# Patient Record
Sex: Male | Born: 1990 | Race: White | Hispanic: No | Marital: Single | Smoking: Current every day smoker
Health system: Southern US, Community
[De-identification: ages and names within clinical notes are randomized; demographics above are authoritative.]

---
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
## 2012-01-18 ENCOUNTER — Encounter (HOSPITAL_COMMUNITY): Payer: Self-pay

## 2012-01-18 ENCOUNTER — Emergency Department (HOSPITAL_COMMUNITY)
Admission: EM | Admit: 2012-01-18 | Discharge: 2012-01-19 | Disposition: A | Discharge status: Home / Self Care | Payer: Self-pay | Attending: Emergency Medicine | Admitting: Emergency Medicine

## 2012-01-18 ENCOUNTER — Emergency Department (HOSPITAL_COMMUNITY): Payer: Self-pay

## 2012-01-18 DIAGNOSIS — S63602A Unspecified sprain of left thumb, initial encounter: Secondary | ICD-10-CM

## 2012-01-18 DIAGNOSIS — Y929 Unspecified place or not applicable: Secondary | ICD-10-CM | POA: Insufficient documentation

## 2012-01-18 DIAGNOSIS — IMO0002 Reserved for concepts with insufficient information to code with codable children: Secondary | ICD-10-CM | POA: Insufficient documentation

## 2012-01-18 DIAGNOSIS — Y939 Activity, unspecified: Secondary | ICD-10-CM | POA: Insufficient documentation

## 2012-01-18 DIAGNOSIS — S6390XA Sprain of unspecified part of unspecified wrist and hand, initial encounter: Secondary | ICD-10-CM | POA: Insufficient documentation

## 2012-01-18 DIAGNOSIS — F17200 Nicotine dependence, unspecified, uncomplicated: Secondary | ICD-10-CM | POA: Insufficient documentation

## 2012-01-18 NOTE — ED Provider Notes (Signed)
History     CSN: [id number]  Arrival date & time 01/18/12  2323   First MD Initiated Contact with Patient 01/18/12 2334      Chief Complaint  Patient presents with  . Finger Injury    (Consider location/radiation/quality/duration/timing/severity/associated sxs/prior treatment) HPI Comments: Patient with hx of chronic pain to the left thumb c/o increased pain today after he struck his thumb on a door earlier today.  Describes a throbbing sensation to the proximal thumb and into the wrist.  He reports some swelling earlier.  He has been taking ibuprofen and Aleve for the pain.  Pain to the thumb is worse with movement.  He denies numbness, weakness to the hand or elbow pain.  Patient is left hand dominant  The history is provided by the patient.    History reviewed. No pertinent past medical history.  History reviewed. No pertinent past surgical history.  No family history on file.  History  Substance Use Topics  . Smoking status: Current Every Day Smoker  . Smokeless tobacco: Not on file  . Alcohol Use: No      Review of Systems  Constitutional: Negative for fever and chills.  Genitourinary: Negative for dysuria and difficulty urinating.  Musculoskeletal: Positive for joint swelling and arthralgias.  Skin: Negative for color change and wound.  All other systems reviewed and are negative.    Allergies  Review of patient's allergies indicates no known allergies.  Home Medications  No current outpatient prescriptions on file.  BP 103/82  Pulse 98  Temp 98.6 F (37 C) (Oral)  Resp 20  SpO2 98%  Physical Exam  Nursing note and vitals reviewed. Constitutional: He is oriented to person, place, and time. He appears well-developed and well-nourished. No distress.  HENT:  Head: Normocephalic and atraumatic.  Cardiovascular: Normal rate, regular rhythm, normal heart sounds and intact distal pulses.   No murmur heard. Pulmonary/Chest: Effort normal and breath  sounds normal.  Musculoskeletal: He exhibits tenderness. He exhibits no edema.       Left hand: He exhibits decreased range of motion, tenderness and swelling. He exhibits normal two-point discrimination, normal capillary refill, no deformity and no laceration. Normal strength noted.       Hands:      ttp of the proximal left thumb extending to the thenar eminence.  Radial pulse is brisk, distal sensation is slightly diminished.  CR< 2 sec.  No bruising or deformity.    Neurological: He is alert and oriented to person, place, and time. He exhibits normal muscle tone. Coordination normal.  Skin: Skin is warm and dry.    ED Course  Procedures (including critical care time)  Labs Reviewed - No data to display No results found.   Dg Finger Thumb Left  01/19/2012  *RADIOLOGY REPORT*  Clinical Data: Hyperextension injury, left thumb pain.  LEFT THUMB 2+V  Comparison: None.  Findings: No displaced fracture.  No dislocation.  No aggressive osseous lesion.  IMPRESSION: No acute osseous abnormality of the left thumb.   Original Report Authenticated By: Waneta Martins, M.D.      MDM   velcro thumb spica applied, pain improved, remains NV intact  Likely sprain to the thumb.  Pt agrees to f/u with orthopedics.  Prescribed: Naprosyn Ultram #20       Akeel Reffner L. [place], [place] 01/19/12 [phone]

## 2012-01-18 NOTE — ED Notes (Signed)
Pt states he hyperextended his left thumb a month ago, has been having pain to same since then, states he hit it again tonight and wants it checked, mild swelling noted.

## 2012-01-19 MED ORDER — TRAMADOL HCL 50 MG PO TABS: 50.0000 mg | ORAL_TABLET | Freq: Four times a day (QID) | ORAL | Status: DC | PRN

## 2012-01-19 MED ORDER — HYDROCODONE-ACETAMINOPHEN 5-325 MG PO TABS: ORAL_TABLET | ORAL | Status: DC

## 2012-01-19 MED ORDER — NAPROXEN 500 MG PO TABS: 500.0000 mg | ORAL_TABLET | Freq: Two times a day (BID) | ORAL | Status: DC

## 2012-01-19 NOTE — ED Provider Notes (Signed)
Medical screening examination/treatment/procedure(s) were performed by non-physician practitioner and as supervising physician I was immediately available for consultation/collaboration.   Traniece Boffa, MD 01/19/12 0634 

## 2012-01-22 MED FILL — Hydrocodone-Acetaminophen Tab 5-325 MG: ORAL | Qty: 6 | Status: AC

## 2014-03-22 ENCOUNTER — Emergency Department: Payer: Self-pay | Admitting: Internal Medicine

## 2014-03-22 LAB — COMPREHENSIVE METABOLIC PANEL
ALBUMIN: 4.1 g/dL (ref 3.4–5.0)
ALK PHOS: 89 U/L
Anion Gap: 7 (ref 7–16)
BUN: 8 mg/dL (ref 7–18)
Bilirubin,Total: 0.4 mg/dL (ref 0.2–1.0)
CHLORIDE: 110 mmol/L — AB (ref 98–107)
CO2: 27 mmol/L (ref 21–32)
CREATININE: 0.97 mg/dL (ref 0.60–1.30)
Calcium, Total: 8.9 mg/dL (ref 8.5–10.1)
Glucose: 98 mg/dL (ref 65–99)
OSMOLALITY: 285 (ref 275–301)
POTASSIUM: 3.7 mmol/L (ref 3.5–5.1)
SGOT(AST): 23 U/L (ref 15–37)
SGPT (ALT): 24 U/L
SODIUM: 144 mmol/L (ref 136–145)
Total Protein: 7.8 g/dL (ref 6.4–8.2)

## 2014-03-22 LAB — CBC
HCT: 47.9 % (ref 40.0–52.0)
HGB: 15.6 g/dL (ref 13.0–18.0)
MCH: 29.5 pg (ref 26.0–34.0)
MCHC: 32.7 g/dL (ref 32.0–36.0)
MCV: 90 fL (ref 80–100)
Platelet: 220 10*3/uL (ref 150–440)
RBC: 5.3 10*6/uL (ref 4.40–5.90)
RDW: 13.5 % (ref 11.5–14.5)
WBC: 8.9 10*3/uL (ref 3.8–10.6)

## 2014-03-22 LAB — ED INFLUENZA
H1N1FLUPCR: NOT DETECTED
INFLBPCR: NEGATIVE
Influenza A By PCR: NEGATIVE

## 2014-03-22 LAB — CK TOTAL AND CKMB (NOT AT ARMC)
CK, TOTAL: 67 U/L (ref 39–308)
CK-MB: <0.5 ng/mL — ABNORMAL LOW (ref 0.5–3.6)

## 2014-08-16 ENCOUNTER — Encounter (HOSPITAL_COMMUNITY): Payer: Self-pay | Admitting: *Deleted

## 2014-08-16 ENCOUNTER — Emergency Department (HOSPITAL_COMMUNITY)
Admission: EM | Admit: 2014-08-16 | Discharge: 2014-08-16 | Disposition: A | Discharge status: Home / Self Care | Payer: Commercial Indemnity | Attending: Emergency Medicine | Admitting: Emergency Medicine

## 2014-08-16 ENCOUNTER — Emergency Department (HOSPITAL_COMMUNITY): Payer: Commercial Indemnity

## 2014-08-16 DIAGNOSIS — Y9389 Activity, other specified: Secondary | ICD-10-CM | POA: Insufficient documentation

## 2014-08-16 DIAGNOSIS — Y92009 Unspecified place in unspecified non-institutional (private) residence as the place of occurrence of the external cause: Secondary | ICD-10-CM | POA: Diagnosis not present

## 2014-08-16 DIAGNOSIS — Y999 Unspecified external cause status: Secondary | ICD-10-CM | POA: Diagnosis not present

## 2014-08-16 DIAGNOSIS — S8001XA Contusion of right knee, initial encounter: Secondary | ICD-10-CM

## 2014-08-16 DIAGNOSIS — S80211A Abrasion, right knee, initial encounter: Secondary | ICD-10-CM | POA: Insufficient documentation

## 2014-08-16 DIAGNOSIS — S8991XA Unspecified injury of right lower leg, initial encounter: Secondary | ICD-10-CM | POA: Diagnosis present

## 2014-08-16 DIAGNOSIS — W010XXA Fall on same level from slipping, tripping and stumbling without subsequent striking against object, initial encounter: Secondary | ICD-10-CM | POA: Diagnosis not present

## 2014-08-16 DIAGNOSIS — Z720 Tobacco use: Secondary | ICD-10-CM | POA: Diagnosis not present

## 2014-08-16 DIAGNOSIS — M25561 Pain in right knee: Secondary | ICD-10-CM

## 2014-08-16 MED ORDER — OXYCODONE-ACETAMINOPHEN 5-325 MG PO TABS
2.0000 | ORAL_TABLET | Freq: Once | ORAL | Status: AC
  Administered 2014-08-16: 2 via ORAL
  Filled 2014-08-16: qty 2

## 2014-08-16 MED ORDER — NAPROXEN 250 MG PO TABS
500.0000 mg | ORAL_TABLET | Freq: Once | ORAL | Status: AC
  Administered 2014-08-16: 500 mg via ORAL
  Filled 2014-08-16: qty 2

## 2014-08-16 MED ORDER — OXYCODONE-ACETAMINOPHEN 5-325 MG PO TABS
1.0000 | ORAL_TABLET | Freq: Four times a day (QID) | ORAL | Status: DC | PRN
Start: 2014-08-16 — End: 2016-10-08

## 2014-08-16 MED ORDER — NAPROXEN 500 MG PO TABS: 500.0000 mg | ORAL_TABLET | Freq: Two times a day (BID) | ORAL | Status: DC

## 2014-08-16 NOTE — Discharge Instructions (Signed)
Abrasion An abrasion is a cut or scrape of the skin. Abrasions do not extend through all layers of the skin and most heal within 10 days. It is important to care for your abrasion properly to prevent infection. CAUSES  Most abrasions are caused by falling on, or gliding across, the ground or other surface. When your skin rubs on something, the outer and inner layer of skin rubs off, causing an abrasion. DIAGNOSIS  Your caregiver will be able to diagnose an abrasion during a physical exam.  TREATMENT  Your treatment depends on how large and deep the abrasion is. Generally, your abrasion will be cleaned with water and a mild soap to remove any dirt or debris. An antibiotic ointment may be put over the abrasion to prevent an infection. A bandage (dressing) may be wrapped around the abrasion to keep it from getting dirty.  You may need a tetanus shot if:  You cannot remember when you had your last tetanus shot.  You have never had a tetanus shot.  The injury broke your skin. If you get a tetanus shot, your arm may swell, get red, and feel warm to the touch. This is common and not a problem. If you need a tetanus shot and you choose not to have one, there is a rare chance of getting tetanus. Sickness from tetanus can be serious.  HOME CARE INSTRUCTIONS   If a dressing was applied, change it at least once a day or as directed by your caregiver. If the bandage sticks, soak it off with warm water.   Wash the area with water and a mild soap to remove all the ointment 2 times a day. Rinse off the soap and pat the area dry with a clean towel.   Reapply any ointment as directed by your caregiver. This will help prevent infection and keep the bandage from sticking. Use gauze over the wound and under the dressing to help keep the bandage from sticking.   Change your dressing right away if it becomes wet or dirty.   Only take over-the-counter or prescription medicines for pain, discomfort, or fever as  directed by your caregiver.   Follow up with your caregiver within 24-48 hours for a wound check, or as directed. If you were not given a wound-check appointment, look closely at your abrasion for redness, swelling, or pus. These are signs of infection. SEEK IMMEDIATE MEDICAL CARE IF:   You have increasing pain in the wound.   You have redness, swelling, or tenderness around the wound.   You have pus coming from the wound.   You have a fever or persistent symptoms for more than 2-3 days.  You have a fever and your symptoms suddenly get worse.  You have a bad smell coming from the wound or dressing.  MAKE SURE YOU:   Understand these instructions.  Will watch your condition.  Will get help right away if you are not doing well or get worse. Document Released: 12/21/2004 Document Revised: 02/28/2012 Document Reviewed: 02/14/2011 [facility] Patient Information 2015 [place], [place]. This information is not intended to replace advice given to you by your health care provider. Make sure you discuss any questions you have with your health care provider.  Cryotherapy Cryotherapy means treatment with cold. Ice or gel packs can be used to reduce both pain and swelling. Ice is the most helpful within the first 24 to 48 hours after an injury or flare-up from overusing a muscle or joint. Sprains, strains, spasms,  burning pain, shooting pain, and aches can all be eased with ice. Ice can also be used when recovering from surgery. Ice is effective, has very few side effects, and is safe for most people to use. PRECAUTIONS  Ice is not a safe treatment option for people with:  Raynaud phenomenon. This is a condition affecting small blood vessels in the extremities. Exposure to cold may cause your problems to return.  Cold hypersensitivity. There are many forms of cold hypersensitivity, including:  Cold urticaria. Red, itchy hives appear on the skin when the tissues begin to warm after being  iced.  Cold erythema. This is a red, itchy rash caused by exposure to cold.  Cold hemoglobinuria. Red blood cells break down when the tissues begin to warm after being iced. The hemoglobin that carry oxygen are passed into the urine because they cannot combine with blood proteins fast enough.  Numbness or altered sensitivity in the area being iced. If you have any of the following conditions, do not use ice until you have discussed cryotherapy with your caregiver:  Heart conditions, such as arrhythmia, angina, or chronic heart disease.  High blood pressure.  Healing wounds or open skin in the area being iced.  Current infections.  Rheumatoid arthritis.  Poor circulation.  Diabetes. Ice slows the blood flow in the region it is applied. This is beneficial when trying to stop inflamed tissues from spreading irritating chemicals to surrounding tissues. However, if you expose your skin to cold temperatures for too long or without the proper protection, you can damage your skin or nerves. Watch for signs of skin damage due to cold. HOME CARE INSTRUCTIONS Follow these tips to use ice and cold packs safely.  Place a dry or damp towel between the ice and skin. A damp towel will cool the skin more quickly, so you may need to shorten the time that the ice is used.  For a more rapid response, add gentle compression to the ice.  Ice for no more than 10 to 20 minutes at a time. The bonier the area you are icing, the less time it will take to get the benefits of ice.  Check your skin after 5 minutes to make sure there are no signs of a poor response to cold or skin damage.  Rest 20 minutes or more between uses.  Once your skin is numb, you can end your treatment. You can test numbness by very lightly touching your skin. The touch should be so light that you do not see the skin dimple from the pressure of your fingertip. When using ice, most people will feel these normal sensations in this order:  cold, burning, aching, and numbness.  Do not use ice on someone who cannot communicate their responses to pain, such as small children or people with dementia. HOW TO MAKE AN ICE PACK Ice packs are the most common way to use ice therapy. Other methods include ice massage, ice baths, and cryosprays. Muscle creams that cause a cold, tingly feeling do not offer the same benefits that ice offers and should not be used as a substitute unless recommended by your caregiver. To make an ice pack, do one of the following:  Place crushed ice or a bag of frozen vegetables in a sealable plastic bag. Squeeze out the excess air. Place this bag inside another plastic bag. Slide the bag into a pillowcase or place a damp towel between your skin and the bag.  Mix 3 parts water with  1 part rubbing alcohol. Freeze the mixture in a sealable plastic bag. When you remove the mixture from the freezer, it will be slushy. Squeeze out the excess air. Place this bag inside another plastic bag. Slide the bag into a pillowcase or place a damp towel between your skin and the bag. SEEK MEDICAL CARE IF:  You develop white spots on your skin. This may give the skin a blotchy (mottled) appearance.  Your skin turns blue or pale.  Your skin becomes waxy or hard.  Your swelling gets worse. MAKE SURE YOU:   Understand these instructions.  Will watch your condition.  Will get help right away if you are not doing well or get worse. Document Released: 11/07/2010 Document Revised: 07/28/2013 Document Reviewed: 11/07/2010 [facility] Patient Information 2015 [place], [place]. This information is not intended to replace advice given to you by your health care provider. Make sure you discuss any questions you have with your health care provider.  Contusion A contusion is a deep bruise. Contusions are the result of an injury that caused bleeding under the skin. The contusion may turn blue, purple, or yellow. Minor injuries will give you  a painless contusion, but more severe contusions may stay painful and swollen for a few weeks.  CAUSES  A contusion is usually caused by a blow, trauma, or direct force to an area of the body. SYMPTOMS   Swelling and redness of the injured area.  Bruising of the injured area.  Tenderness and soreness of the injured area.  Pain. DIAGNOSIS  The diagnosis can be made by taking a history and physical exam. An X-ray, CT scan, or MRI may be needed to determine if there were any associated injuries, such as fractures. TREATMENT  Specific treatment will depend on what area of the body was injured. In general, the best treatment for a contusion is resting, icing, elevating, and applying cold compresses to the injured area. Over-the-counter medicines may also be recommended for pain control. Ask your caregiver what the best treatment is for your contusion. HOME CARE INSTRUCTIONS   Put ice on the injured area.  Put ice in a plastic bag.  Place a towel between your skin and the bag.  Leave the ice on for 15-20 minutes, 3-4 times a day, or as directed by your health care provider.  Only take over-the-counter or prescription medicines for pain, discomfort, or fever as directed by your caregiver. Your caregiver may recommend avoiding anti-inflammatory medicines (aspirin, ibuprofen, and naproxen) for 48 hours because these medicines may increase bruising.  Rest the injured area.  If possible, elevate the injured area to reduce swelling. SEEK IMMEDIATE MEDICAL CARE IF:   You have increased bruising or swelling.  You have pain that is getting worse.  Your swelling or pain is not relieved with medicines. MAKE SURE YOU:   Understand these instructions.  Will watch your condition.  Will get help right away if you are not doing well or get worse. Document Released: 12/21/2004 Document Revised: 03/18/2013 Document Reviewed: 01/16/2011 [facility] Patient Information 2015 [place], [place]. This  information is not intended to replace advice given to you by your health care provider. Make sure you discuss any questions you have with your health care provider.  Knee Pain The knee is the complex joint between your thigh and your lower leg. It is made up of bones, tendons, ligaments, and cartilage. The bones that make up the knee are:  The femur in the thigh.  The tibia and fibula  in the lower leg.  The patella or kneecap riding in the groove on the lower femur. CAUSES  Knee pain is a common complaint with many causes. A few of these causes are:  Injury, such as:  A ruptured ligament or tendon injury.  Torn cartilage.  Medical conditions, such as:  Gout  Arthritis  Infections  Overuse, over training, or overdoing a physical activity. Knee pain can be minor or severe. Knee pain can accompany debilitating injury. Minor knee problems often respond well to self-care measures or get well on their own. More serious injuries may need medical intervention or even surgery. SYMPTOMS The knee is complex. Symptoms of knee problems can vary widely. Some of the problems are:  Pain with movement and weight bearing.  Swelling and tenderness.  Buckling of the knee.  Inability to straighten or extend your knee.  Your knee locks and you cannot straighten it.  Warmth and redness with pain and fever.  Deformity or dislocation of the kneecap. DIAGNOSIS  Determining what is wrong may be very straight forward such as when there is an injury. It can also be challenging because of the complexity of the knee. Tests to make a diagnosis may include:  Your caregiver taking a history and doing a physical exam.  Routine X-rays can be used to rule out other problems. X-rays will not reveal a cartilage tear. Some injuries of the knee can be diagnosed by:  Arthroscopy a surgical technique by which a small video camera is inserted through tiny incisions on the sides of the knee. This procedure  is used to examine and repair internal knee joint problems. Tiny instruments can be used during arthroscopy to repair the torn knee cartilage (meniscus).  Arthrography is a radiology technique. A contrast liquid is directly injected into the knee joint. Internal structures of the knee joint then become visible on X-ray film.  An MRI scan is a non X-ray radiology procedure in which magnetic fields and a computer produce two- or three-dimensional images of the inside of the knee. Cartilage tears are often visible using an MRI scanner. MRI scans have largely replaced arthrography in diagnosing cartilage tears of the knee.  Blood work.  Examination of the fluid that helps to lubricate the knee joint (synovial fluid). This is done by taking a sample out using a needle and a syringe. TREATMENT The treatment of knee problems depends on the cause. Some of these treatments are:  Depending on the injury, proper casting, splinting, surgery, or physical therapy care will be needed.  Give yourself adequate recovery time. Do not overuse your joints. If you begin to get sore during workout routines, back off. Slow down or do fewer repetitions.  For repetitive activities such as cycling or running, maintain your strength and nutrition.  Alternate muscle groups. For example, if you are a weight lifter, work the upper body on one day and the lower body the next.  Either tight or weak muscles do not give the proper support for your knee. Tight or weak muscles do not absorb the stress placed on the knee joint. Keep the muscles surrounding the knee strong.  Take care of mechanical problems.  If you have flat feet, orthotics or special shoes may help. See your caregiver if you need help.  Arch supports, sometimes with wedges on the inner or outer aspect of the heel, can help. These can shift pressure away from the side of the knee most bothered by osteoarthritis.  A brace called an "  unloader" brace also may be  used to help ease the pressure on the most arthritic side of the knee.  If your caregiver has prescribed crutches, braces, wraps or ice, use as directed. The acronym for this is PRICE. This means protection, rest, ice, compression, and elevation.  Nonsteroidal anti-inflammatory drugs (NSAIDs), can help relieve pain. But if taken immediately after an injury, they may actually increase swelling. Take NSAIDs with food in your stomach. Stop them if you develop stomach problems. Do not take these if you have a history of ulcers, stomach pain, or bleeding from the bowel. Do not take without your caregiver's approval if you have problems with fluid retention, heart failure, or kidney problems.  For ongoing knee problems, physical therapy may be helpful.  Glucosamine and chondroitin are over-the-counter dietary supplements. Both may help relieve the pain of osteoarthritis in the knee. These medicines are different from the usual anti-inflammatory drugs. Glucosamine may decrease the rate of cartilage destruction.  Injections of a corticosteroid drug into your knee joint may help reduce the symptoms of an arthritis flare-up. They may provide pain relief that lasts a few months. You may have to wait a few months between injections. The injections do have a small increased risk of infection, water retention, and elevated blood sugar levels.  Hyaluronic acid injected into damaged joints may ease pain and provide lubrication. These injections may work by reducing inflammation. A series of shots may give relief for as long as 6 months.  Topical painkillers. Applying certain ointments to your skin may help relieve the pain and stiffness of osteoarthritis. Ask your pharmacist for suggestions. Many over the-counter products are approved for temporary relief of arthritis pain.  In some countries, doctors often prescribe topical NSAIDs for relief of chronic conditions such as arthritis and tendinitis. A review of  treatment with NSAID creams found that they worked as well as oral medications but without the serious side effects. PREVENTION  Maintain a healthy weight. Extra pounds put more strain on your joints.  Get strong, stay limber. Weak muscles are a common cause of knee injuries. Stretching is important. Include flexibility exercises in your workouts.  Be smart about exercise. If you have osteoarthritis, chronic knee pain or recurring injuries, you may need to change the way you exercise. This does not mean you have to stop being active. If your knees ache after jogging or playing basketball, consider switching to swimming, water aerobics, or other low-impact activities, at least for a few days a week. Sometimes limiting high-impact activities will provide relief.  Make sure your shoes fit well. Choose footwear that is right for your sport.  Protect your knees. Use the proper gear for knee-sensitive activities. Use kneepads when playing volleyball or laying carpet. Buckle your seat belt every time you drive. Most shattered kneecaps occur in car accidents.  Rest when you are tired. SEEK MEDICAL CARE IF:  You have knee pain that is continual and does not seem to be getting better.  SEEK IMMEDIATE MEDICAL CARE IF:  Your knee joint feels hot to the touch and you have a high fever. MAKE SURE YOU:   Understand these instructions.  Will watch your condition.  Will get help right away if you are not doing well or get worse. Document Released: 01/08/2007 Document Revised: 06/05/2011 Document Reviewed: 01/08/2007 [facility] Patient Information 2015 [place], [place]. This information is not intended to replace advice given to you by your health care provider. Make sure you discuss any questions you have  with your health care provider. ° °

## 2014-08-16 NOTE — ED Provider Notes (Signed)
CSN: [id number]     Arrival date & time 08/16/14  0146 History  This chart was scribed for Marisa Severinlga Brittannie Tawney, MD by Abel PrestoKara Demonbreun, ED Scribe. This patient was seen in room D32C/D32C and the patient's care was started at 3:47 AM.    Chief Complaint  Patient presents with  . Leg Injury     The history is provided by the patient. No language interpreter was used.   HPI Comments: Paul Mckee is a 24 y.o. male who presents to the Emergency Department complaining of left knee injury around 9:00 PM. Pt was playing with dog in his house when dog ran between his legs and tripped him, causing him to fall onto a concrete slab. Pt state he felt his kneecap moved to the right and heard associated popping sound at onset. He states kneecap popped back into place. Noted abrasions to left leg. Pt has taken ibuprofen and Vicodin for relief. Pt denies numbness and weakness.   History reviewed. No pertinent past medical history. History reviewed. No pertinent past surgical history. History reviewed. No pertinent family history. History  Substance Use Topics  . Smoking status: Current Every Day Smoker  . Smokeless tobacco: Not on file  . Alcohol Use: No    Review of Systems  Musculoskeletal: Positive for arthralgias.  Skin: Positive for wound.  Neurological: Negative for weakness and numbness.     Allergies  Review of patient's allergies indicates no known allergies.  Home Medications   Prior to Admission medications   Medication Sig Start Date End Date Taking? Authorizing Provider  ibuprofen (ADVIL,MOTRIN) 200 MG tablet Take 800 mg by mouth every 8 (eight) hours as needed for moderate pain.   Yes Historical Provider, MD  HYDROcodone-acetaminophen (NORCO/VICODIN) 5-325 MG per tablet Take one-two tabs po q 4-6 hrs prn pain Patient not taking: Reported on 08/16/2014 01/19/12   Tammy Triplett, PA-C  naproxen (NAPROSYN) 500 MG tablet Take 1 tablet (500 mg total) by mouth 2 (two) times daily with a  meal. Patient not taking: Reported on 08/16/2014 01/19/12   Tammy Triplett, PA-C  traMADol (ULTRAM) 50 MG tablet Take 1 tablet (50 mg total) by mouth every 6 (six) hours as needed for pain. Patient not taking: Reported on 08/16/2014 01/19/12   Tammy Triplett, PA-C   BP 114/69 mmHg  Pulse 95  Temp(Src) 98.3 F (36.8 C) (Oral)  Resp 16  Ht 5\' 8"  (1.727 m)  Wt 162 lb 2 oz (73.539 kg)  BMI 24.66 kg/m2  SpO2 97% Physical Exam  Constitutional: He is oriented to person, place, and time. He appears well-developed and well-nourished. No distress.  Cardiovascular: Normal rate, regular rhythm and normal heart sounds.  Exam reveals no gallop and no friction rub.   No murmur heard. Pulmonary/Chest: Effort normal and breath sounds normal. No respiratory distress. He has no wheezes. He has no rales. He exhibits no tenderness.  Musculoskeletal:  Patient has decreased range of motion of right knee secondary to pain.  He has abrasion to the inferior portion of the knee just below the patella.  Anterior and posterior drawer test are negative.  He has no varus or valgus laxity.  No effusion.  No joint line tenderness.  No crepitus over the patella.  Neurological: He is alert and oriented to person, place, and time. He exhibits normal muscle tone. Coordination normal.  Nursing note and vitals reviewed.   ED Course  Procedures (including critical care time) DIAGNOSTIC STUDIES: Oxygen Saturation is 100% on room air,  normal by my interpretation.    COORDINATION OF CARE: 3:51 AM Discussed treatment plan with patient at beside, the patient agrees with the plan and has no further questions at this time.  Labs Review Labs Reviewed - No data to display  Imaging Review Dg Knee Complete 4 Views Right  08/16/2014   CLINICAL DATA:  Fall with abrasion and pain to medial right knee.  EXAM: RIGHT KNEE - COMPLETE 4+ VIEW  COMPARISON:  None.  FINDINGS: No fracture or dislocation. The alignment and joint spaces are  maintained. No erosion or periosteal reaction. No joint effusion. No radiopaque foreign body or focal soft tissue abnormality.  IMPRESSION: No fracture or dislocation of the right knee.   Electronically Signed   By: Rubye Oaks M.D.   On: 08/16/2014 03:21     EKG Interpretation None      MDM   Final diagnoses:  Knee abrasion, right, initial encounter  Knee pain, right  Knee contusion, right, initial encounter    I personally performed the services described in this documentation, which was scribed in my presence. The recorded information has been reviewed and is accurate.  24 year old male status post fall on right knee.  Patient may have had a transitory patellar dislocation, but no focal findings on exam currently, aside from abrasion.  No effusion.  Plan for Ace wrap, crutches, rice therapy.  To follow up with Ortho in one week if not improved.    Marisa Severin, MD 08/16/14 [phone]

## 2014-08-16 NOTE — ED Notes (Signed)
Ace bandage placed on right knee.

## 2014-08-16 NOTE — ED Notes (Signed)
Reviewed use of crutches properly, adjusted height, and instructed not to drive self home.

## 2014-08-16 NOTE — ED Notes (Signed)
Pt in injury to his right leg after a fall at home, states he tripped while playing with his dog and hit his right leg on a concrete all in his house, abrasion noted below his knee, points to knee when describing pain, ambulatory with increased pain

## 2016-10-08 ENCOUNTER — Encounter (HOSPITAL_COMMUNITY): Payer: Self-pay | Admitting: Emergency Medicine

## 2016-10-08 ENCOUNTER — Emergency Department (HOSPITAL_COMMUNITY): Payer: Medicaid Other

## 2016-10-08 ENCOUNTER — Emergency Department (HOSPITAL_COMMUNITY)
Admission: EM | Admit: 2016-10-08 | Discharge: 2016-10-08 | Disposition: A | Discharge status: Home / Self Care | Payer: Medicaid Other | Attending: Emergency Medicine | Admitting: Emergency Medicine

## 2016-10-08 DIAGNOSIS — L03113 Cellulitis of right upper limb: Secondary | ICD-10-CM | POA: Diagnosis not present

## 2016-10-08 DIAGNOSIS — F17210 Nicotine dependence, cigarettes, uncomplicated: Secondary | ICD-10-CM | POA: Diagnosis not present

## 2016-10-08 DIAGNOSIS — R21 Rash and other nonspecific skin eruption: Secondary | ICD-10-CM | POA: Diagnosis present

## 2016-10-08 LAB — CBC WITH DIFFERENTIAL/PLATELET
BASOS PCT: 0 %
Basophils Absolute: 0 10*3/uL (ref 0.0–0.1)
Eosinophils Absolute: 0.2 10*3/uL (ref 0.0–0.7)
Eosinophils Relative: 1 %
HEMATOCRIT: 45.8 % (ref 39.0–52.0)
Hemoglobin: 15.6 g/dL (ref 13.0–17.0)
LYMPHS PCT: 11 %
Lymphs Abs: 2.2 10*3/uL (ref 0.7–4.0)
MCH: 30.4 pg (ref 26.0–34.0)
MCHC: 34.1 g/dL (ref 30.0–36.0)
MCV: 89.3 fL (ref 78.0–100.0)
MONO ABS: 1.2 10*3/uL — AB (ref 0.1–1.0)
MONOS PCT: 6 %
NEUTROS ABS: 16.3 10*3/uL — AB (ref 1.7–7.7)
Neutrophils Relative %: 82 %
Platelets: 280 10*3/uL (ref 150–400)
RBC: 5.13 MIL/uL (ref 4.22–5.81)
RDW: 15 % (ref 11.5–15.5)
WBC: 19.9 10*3/uL — ABNORMAL HIGH (ref 4.0–10.5)

## 2016-10-08 LAB — BASIC METABOLIC PANEL
Anion gap: 11 (ref 5–15)
BUN: 8 mg/dL (ref 6–20)
CALCIUM: 9.9 mg/dL (ref 8.9–10.3)
CO2: 28 mmol/L (ref 22–32)
CREATININE: 0.81 mg/dL (ref 0.61–1.24)
Chloride: 101 mmol/L (ref 101–111)
GFR calc Af Amer: >60 mL/min (ref >60)
GFR calc non Af Amer: >60 mL/min (ref >60)
GLUCOSE: 108 mg/dL — AB (ref 65–99)
Potassium: 4.1 mmol/L (ref 3.5–5.1)
Sodium: 140 mmol/L (ref 135–145)

## 2016-10-08 LAB — HEPATIC FUNCTION PANEL
ALT: 73 U/L — ABNORMAL HIGH (ref 17–63)
AST: 28 U/L (ref 15–41)
Albumin: 4.1 g/dL (ref 3.5–5.0)
Alkaline Phosphatase: 93 U/L (ref 38–126)
BILIRUBIN DIRECT: 0.1 mg/dL (ref 0.1–0.5)
BILIRUBIN TOTAL: 0.8 mg/dL (ref 0.3–1.2)
Indirect Bilirubin: 0.7 mg/dL (ref 0.3–0.9)
Total Protein: 8.4 g/dL — ABNORMAL HIGH (ref 6.5–8.1)

## 2016-10-08 MED ORDER — VANCOMYCIN HCL IN DEXTROSE 1-5 GM/200ML-% IV SOLN
1000.0000 mg | Freq: Once | INTRAVENOUS | Status: AC
  Administered 2016-10-08: 1000 mg via INTRAVENOUS
  Filled 2016-10-08: qty 200

## 2016-10-08 MED ORDER — OXYCODONE-ACETAMINOPHEN 5-325 MG PO TABS
1.0000 | ORAL_TABLET | Freq: Once | ORAL | Status: AC
  Administered 2016-10-08: 1 via ORAL
  Filled 2016-10-08: qty 1

## 2016-10-08 MED ORDER — TRAMADOL HCL 50 MG PO TABS: 50.0000 mg | ORAL_TABLET | Freq: Four times a day (QID) | ORAL | 0 refills | Status: AC | PRN

## 2016-10-08 MED ORDER — SULFAMETHOXAZOLE-TRIMETHOPRIM 800-160 MG PO TABS: 1.0000 | ORAL_TABLET | Freq: Two times a day (BID) | ORAL | 0 refills | Status: DC

## 2016-10-08 NOTE — ED Notes (Signed)
Lab in room to do blood cultures.

## 2016-10-08 NOTE — ED Notes (Signed)
Instructed pt to take all of antibiotics as prescribed. 

## 2016-10-08 NOTE — Discharge Instructions (Signed)
Return if problems getting worse and expect to stay in the hospital. Get rechecked in a couple days

## 2016-10-08 NOTE — ED Provider Notes (Addendum)
[facility]EMER[facility]Y DEPT Provider Note   CSN: [id number] Arrival date & time: 10/08/16  1418     History   Chief Complaint Chief Complaint  Patient presents with  . Cellulitis    HPI Paul Mckee is a 26 y.o. male.  Patient complains of pain to right arm. Patient states he accidentally hit his arm a few days ago but it started getting swollen red today    Rash   This is a new problem. The current episode started more than 2 days ago. The problem has not changed since onset.The problem is associated with nothing. There has been no fever. Affected Location: Right upper extremity. The pain is at a severity of 6/10. The pain is moderate. The pain has been constant since onset. Pertinent negatives include no blisters.    History reviewed. No pertinent past medical history.  There are no active problems to display for this patient.   History reviewed. No pertinent surgical history.     Home Medications    Prior to Admission medications   Medication Sig Start Date End Date Taking? Authorizing Provider  ibuprofen (ADVIL,MOTRIN) 200 MG tablet Take 800 mg by mouth every 8 (eight) hours as needed for headache or moderate pain.   Yes [provider]  sulfamethoxazole-trimethoprim (BACTRIM DS,SEPTRA DS) 800-160 MG tablet Take 1 tablet by mouth 2 (two) times daily. 10/08/16 10/15/16  Bethann BerkshireZammit, Quintarius Ferns, MD  traMADol (ULTRAM) 50 MG tablet Take 1 tablet (50 mg total) by mouth every 6 (six) hours as needed. 10/08/16   Bethann BerkshireZammit, Vallerie Hentz, MD    Family History History reviewed. No pertinent family history.  Social History Social History  Substance Use Topics  . Smoking status: Current Every Day Smoker    Packs/day: 1.00    Types: Cigarettes  . Smokeless tobacco: Never Used  . Alcohol use No     Allergies   Patient has no known allergies.   Review of Systems Review of Systems  Constitutional: Negative for appetite change and fatigue.  HENT: Negative for congestion, ear  discharge and sinus pressure.   Eyes: Negative for discharge.  Respiratory: Negative for cough.   Cardiovascular: Negative for chest pain.  Gastrointestinal: Negative for abdominal pain and diarrhea.  Genitourinary: Negative for frequency and hematuria.  Musculoskeletal: Negative for back pain.       Swelling redness and tenderness to right upper extremity  Skin: Positive for rash.  Neurological: Negative for seizures and headaches.  Psychiatric/Behavioral: Negative for hallucinations.     Physical Exam Updated Vital Signs BP 101/64   Pulse (!) 111   Temp 98.7 F (37.1 C) (Oral)   Resp 18   Ht 5\' 8"  (1.727 m)   Wt 68.4 kg (150 lb 12.8 oz)   SpO2 99%   BMI 22.93 kg/m   Physical Exam  Constitutional: He is oriented to person, place, and time. He appears well-developed.  HENT:  Head: Normocephalic.  Eyes: Conjunctivae and EOM are normal. No scleral icterus.  Neck: Neck supple. No thyromegaly present.  Cardiovascular: Normal rate and regular rhythm.  Exam reveals no gallop and no friction rub.   No murmur heard. Pulmonary/Chest: No stridor. He has no wheezes. He has no rales. He exhibits no tenderness.  Abdominal: He exhibits no distension. There is no tenderness. There is no rebound.  Musculoskeletal: Normal range of motion. He exhibits no edema.  Patient has cellulitis to his right forearm with some redness and swelling to humerus.  Lymphadenopathy:    He has no  cervical adenopathy.  Neurological: He is oriented to person, place, and time. He exhibits normal muscle tone. Coordination normal.  Skin: No rash noted. No erythema.  Psychiatric: He has a normal mood and affect. His behavior is normal.     ED Treatments / Results  Labs (all labs ordered are listed, but only abnormal results are displayed) Labs Reviewed  CBC WITH DIFFERENTIAL/PLATELET - Abnormal; Notable for the following:       Result Value   WBC 19.9 (*)    Neutro Abs 16.3 (*)    Monocytes Absolute 1.2  (*)    All other components within normal limits  BASIC METABOLIC PANEL - Abnormal; Notable for the following:    Glucose, Bld 108 (*)    All other components within normal limits  HEPATIC FUNCTION PANEL - Abnormal; Notable for the following:    Total Protein 8.4 (*)    ALT 73 (*)    All other components within normal limits  CULTURE, BLOOD (ROUTINE X 2)  CULTURE, BLOOD (ROUTINE X 2)    EKG  EKG Interpretation None       Radiology Dg Forearm Right  Result Date: 10/08/2016 CLINICAL DATA:  Forearm swelling for several weeks, no known injury, initial encounter EXAM: RIGHT FOREARM - 2 VIEW COMPARISON:  None. FINDINGS: Subcutaneous edema is noted. No focal mass lesion is seen. No acute bony abnormality is noted. No radiopaque foreign body is seen. IMPRESSION: Soft tissue swelling without acute bony abnormality. Electronically Signed   By: Alcide Clever M.D.   On: 10/08/2016 16:07   Dg Humerus Right  Result Date: 10/08/2016 CLINICAL DATA:  Right arm pain and swelling, no known injury, initial encounter EXAM: RIGHT HUMERUS - 2+ VIEW COMPARISON:  None. FINDINGS: No acute fracture or dislocation is noted. Mild subcutaneous edema is noted in the distal upper arm as well as a proximal forearm consistent with the given clinical history. IMPRESSION: Mild soft tissue swelling without acute bony abnormality. Electronically Signed   By: Alcide Clever M.D.   On: 10/08/2016 16:06    Procedures Procedures (including critical care time)  Medications Ordered in ED Medications  vancomycin (VANCOCIN) IVPB 1000 mg/200 mL premix (0 mg Intravenous Stopped 10/08/16 1759)  oxyCODONE-acetaminophen (PERCOCET/ROXICET) 5-325 MG per tablet 1 tablet (1 tablet Oral Given 10/08/16 1651)     Initial Impression / Assessment and Plan / ED Course  I have reviewed the triage vital signs and the nursing notes.  Pertinent labs & imaging results that were available during my care of the patient were reviewed by me and  considered in my medical decision making (see chart for details).    Patient has cellulitis to right arm. I told the patient that he should be admitted to the hospital for IV antibiotics. Patient refused to be admitted. He understands that the infection could get much worse he could even possibly lose his arm.  Pt has been sent home with bactrim and ultram   Final Clinical Impressions(s) / ED Diagnoses   Final diagnoses:  Right arm cellulitis    New Prescriptions New Prescriptions   SULFAMETHOXAZOLE-TRIMETHOPRIM (BACTRIM DS,SEPTRA DS) 800-160 MG TABLET    Take 1 tablet by mouth 2 (two) times daily.   TRAMADOL (ULTRAM) 50 MG TABLET    Take 1 tablet (50 mg total) by mouth every 6 (six) hours as needed.     Bethann Berkshire, MD 10/08/16 1810    Bethann Berkshire, MD 10/08/16 [phone]

## 2016-10-08 NOTE — ED Triage Notes (Signed)
Pt report having R arm erythema and swelling X2 weeks. Denies injury, bite, or scrape to area. Denies N/V/D/, fever at home. States he cannot clench his fist on R side.

## 2016-10-11 ENCOUNTER — Inpatient Hospital Stay (HOSPITAL_COMMUNITY)
Admission: EM | Admit: 2016-10-11 | Discharge: 2016-10-13 | DRG: 501 | Disposition: A | Discharge status: Home / Self Care | Payer: Medicaid Other | Attending: Internal Medicine | Admitting: Internal Medicine

## 2016-10-11 ENCOUNTER — Emergency Department (HOSPITAL_COMMUNITY): Payer: Medicaid Other

## 2016-10-11 ENCOUNTER — Encounter (HOSPITAL_COMMUNITY): Payer: Self-pay | Admitting: Cardiology

## 2016-10-11 DIAGNOSIS — F17210 Nicotine dependence, cigarettes, uncomplicated: Secondary | ICD-10-CM | POA: Diagnosis present

## 2016-10-11 DIAGNOSIS — Z22322 Carrier or suspected carrier of Methicillin resistant Staphylococcus aureus: Secondary | ICD-10-CM

## 2016-10-11 DIAGNOSIS — Z720 Tobacco use: Secondary | ICD-10-CM | POA: Diagnosis not present

## 2016-10-11 DIAGNOSIS — F17200 Nicotine dependence, unspecified, uncomplicated: Secondary | ICD-10-CM | POA: Diagnosis not present

## 2016-10-11 DIAGNOSIS — M60031 Infective myositis, right forearm: Secondary | ICD-10-CM | POA: Diagnosis present

## 2016-10-11 DIAGNOSIS — M7989 Other specified soft tissue disorders: Secondary | ICD-10-CM | POA: Diagnosis not present

## 2016-10-11 DIAGNOSIS — L02419 Cutaneous abscess of limb, unspecified: Secondary | ICD-10-CM | POA: Diagnosis not present

## 2016-10-11 DIAGNOSIS — L03113 Cellulitis of right upper limb: Secondary | ICD-10-CM | POA: Diagnosis present

## 2016-10-11 DIAGNOSIS — L0291 Cutaneous abscess, unspecified: Secondary | ICD-10-CM

## 2016-10-11 DIAGNOSIS — F1910 Other psychoactive substance abuse, uncomplicated: Secondary | ICD-10-CM | POA: Diagnosis not present

## 2016-10-11 DIAGNOSIS — F1990 Other psychoactive substance use, unspecified, uncomplicated: Secondary | ICD-10-CM | POA: Diagnosis present

## 2016-10-11 DIAGNOSIS — R030 Elevated blood-pressure reading, without diagnosis of hypertension: Secondary | ICD-10-CM | POA: Diagnosis present

## 2016-10-11 DIAGNOSIS — L02413 Cutaneous abscess of right upper limb: Secondary | ICD-10-CM

## 2016-10-11 LAB — CBC WITH DIFFERENTIAL/PLATELET
Basophils Absolute: 0 10*3/uL (ref 0.0–0.1)
Basophils Relative: 0 %
Eosinophils Absolute: 0.2 10*3/uL (ref 0.0–0.7)
Eosinophils Relative: 1 %
HEMATOCRIT: 42.5 % (ref 39.0–52.0)
Hemoglobin: 14.4 g/dL (ref 13.0–17.0)
LYMPHS PCT: 10 %
Lymphs Abs: 1.9 10*3/uL (ref 0.7–4.0)
MCH: 30.6 pg (ref 26.0–34.0)
MCHC: 33.9 g/dL (ref 30.0–36.0)
MCV: 90.2 fL (ref 78.0–100.0)
MONO ABS: 0.6 10*3/uL (ref 0.1–1.0)
MONOS PCT: 3 %
NEUTROS ABS: 15.4 10*3/uL — AB (ref 1.7–7.7)
Neutrophils Relative %: 86 %
Platelets: 345 10*3/uL (ref 150–400)
RBC: 4.71 MIL/uL (ref 4.22–5.81)
RDW: 14.5 % (ref 11.5–15.5)
WBC: 18.1 10*3/uL — ABNORMAL HIGH (ref 4.0–10.5)

## 2016-10-11 LAB — RAPID URINE DRUG SCREEN, HOSP PERFORMED
AMPHETAMINES: NOT DETECTED
BENZODIAZEPINES: NOT DETECTED
Barbiturates: NOT DETECTED
COCAINE: NOT DETECTED
OPIATES: POSITIVE — AB
TETRAHYDROCANNABINOL: NOT DETECTED

## 2016-10-11 LAB — LACTIC ACID, PLASMA: Lactic Acid, Venous: 1.5 mmol/L (ref 0.5–1.9)

## 2016-10-11 LAB — BASIC METABOLIC PANEL
Anion gap: 10 (ref 5–15)
BUN: 12 mg/dL (ref 6–20)
CALCIUM: 9.3 mg/dL (ref 8.9–10.3)
CO2: 25 mmol/L (ref 22–32)
CREATININE: 0.98 mg/dL (ref 0.61–1.24)
Chloride: 101 mmol/L (ref 101–111)
GFR calc Af Amer: >60 mL/min (ref >60)
GFR calc non Af Amer: >60 mL/min (ref >60)
GLUCOSE: 105 mg/dL — AB (ref 65–99)
Potassium: 4.4 mmol/L (ref 3.5–5.1)
Sodium: 136 mmol/L (ref 135–145)

## 2016-10-11 MED ORDER — DEXTROSE-NACL 5-0.9 % IV SOLN
INTRAVENOUS | Status: DC
  Administered 2016-10-12 (×2): via INTRAVENOUS

## 2016-10-11 MED ORDER — HYDROMORPHONE HCL 1 MG/ML IJ SOLN: 1.0000 mg | INTRAMUSCULAR | Status: DC | PRN

## 2016-10-11 MED ORDER — VANCOMYCIN HCL IN DEXTROSE 1-5 GM/200ML-% IV SOLN
1000.0000 mg | Freq: Once | INTRAVENOUS | Status: AC
  Administered 2016-10-11: 1000 mg via INTRAVENOUS
  Filled 2016-10-11: qty 200

## 2016-10-11 MED ORDER — HYDROMORPHONE HCL 1 MG/ML IJ SOLN
1.0000 mg | Freq: Once | INTRAMUSCULAR | Status: AC
  Administered 2016-10-11: 1 mg via INTRAVENOUS
  Filled 2016-10-11: qty 1

## 2016-10-11 MED ORDER — HEPARIN SODIUM (PORCINE) 5000 UNIT/ML IJ SOLN
5000.0000 [IU] | Freq: Three times a day (TID) | INTRAMUSCULAR | Status: DC
  Administered 2016-10-12: 5000 [IU] via SUBCUTANEOUS
  Filled 2016-10-11: qty 1

## 2016-10-11 MED ORDER — ACETAMINOPHEN 650 MG RE SUPP: 650.0000 mg | Freq: Four times a day (QID) | RECTAL | Status: DC | PRN

## 2016-10-11 MED ORDER — ACETAMINOPHEN 325 MG PO TABS
650.0000 mg | ORAL_TABLET | Freq: Four times a day (QID) | ORAL | Status: DC | PRN
  Filled 2016-10-11: qty 2

## 2016-10-11 MED ORDER — ONDANSETRON HCL 4 MG PO TABS: 4.0000 mg | ORAL_TABLET | Freq: Four times a day (QID) | ORAL | Status: DC | PRN

## 2016-10-11 MED ORDER — ONDANSETRON HCL 4 MG/2ML IJ SOLN: 4.0000 mg | Freq: Four times a day (QID) | INTRAMUSCULAR | Status: DC | PRN

## 2016-10-11 MED ORDER — IOPAMIDOL (ISOVUE-300) INJECTION 61%
100.0000 mL | Freq: Once | INTRAVENOUS | Status: AC | PRN
  Administered 2016-10-11: 100 mL via INTRAVENOUS

## 2016-10-11 MED ORDER — SODIUM CHLORIDE 0.9 % IV BOLUS (SEPSIS)
1000.0000 mL | Freq: Once | INTRAVENOUS | Status: AC
  Administered 2016-10-11: 1000 mL via INTRAVENOUS

## 2016-10-11 NOTE — ED Notes (Signed)
Pt states his heart rate is always around 130.  Pt no visible distress.

## 2016-10-11 NOTE — ED Triage Notes (Signed)
Left arm cellulitis .  Seen here 2 days ago with same.  Pt states it is worse.

## 2016-10-11 NOTE — ED Notes (Signed)
Patient stated he hadn't not eaten since he's been here, warmed up dinner tray, and given drink for consumption.

## 2016-10-11 NOTE — ED Provider Notes (Signed)
[facility] Provider Note   CSN: [id number] Arrival date & time: 10/11/16  1630     History   Chief Complaint Chief Complaint  Patient presents with  . Cellulitis    HPI Paul Mckee is a 26 y.o. male.  HPI   Paul Mckee is a 26 y.o. male who returns to the Emergency Department complaining of worsening redness, swelling, and pain to his right forearm. He was seen here on 10/08/2016 for same. He states that he was given IV antibiotics here and discharged home on Bactrim which he states that he is taking but does not seem to be helping. He reports increased redness to his right forearm. Pain is associated with any movement of the arm. He denies any fever, chills, numbness or weakness to the extremity.   History reviewed. No pertinent past medical history.  There are no active problems to display for this patient.   History reviewed. No pertinent surgical history.     Home Medications    Prior to Admission medications   Medication Sig Start Date End Date Taking? Authorizing Provider  ibuprofen (ADVIL,MOTRIN) 200 MG tablet Take 800 mg by mouth every 8 (eight) hours as needed for headache or moderate pain.    [provider]  sulfamethoxazole-trimethoprim (BACTRIM DS,SEPTRA DS) 800-160 MG tablet Take 1 tablet by mouth 2 (two) times daily. 10/08/16 10/15/16  Bethann Berkshire, MD  traMADol (ULTRAM) 50 MG tablet Take 1 tablet (50 mg total) by mouth every 6 (six) hours as needed. 10/08/16   Bethann Berkshire, MD    Family History History reviewed. No pertinent family history.  Social History Social History  Substance Use Topics  . Smoking status: Current Every Day Smoker    Packs/day: 1.00    Types: Cigarettes  . Smokeless tobacco: Never Used  . Alcohol use No     Allergies   Patient has no known allergies.   Review of Systems Review of Systems  Constitutional: Negative for chills and fever.  Respiratory: Negative for chest tightness and  shortness of breath.   Cardiovascular: Negative for chest pain.  Gastrointestinal: Negative for nausea and vomiting.  Musculoskeletal: Positive for myalgias (Right forearm pain). Negative for arthralgias and joint swelling.  Skin: Positive for color change.       Redness and swelling of the right forearm  Hematological: Negative for adenopathy.  All other systems reviewed and are negative.    Physical Exam Updated Vital Signs BP 131/80   Pulse (!) 129   Temp 100.3 F (37.9 C) (Oral)   Resp 16   Ht 5\' 8"  (1.727 m)   Wt 68 kg (150 lb)   SpO2 98%   BMI 22.81 kg/m   Physical Exam  Constitutional: He is oriented to person, place, and time. He appears well-developed and well-nourished. No distress.  HENT:  Head: Normocephalic and atraumatic.  Mouth/Throat: Oropharynx is clear and moist.  Cardiovascular: Normal rate, regular rhythm, normal heart sounds and intact distal pulses.   No murmur heard. Pulmonary/Chest: Effort normal and breath sounds normal. No respiratory distress.  Abdominal: Soft. He exhibits no distension. There is no tenderness. There is no guarding.  Musculoskeletal: He exhibits edema and tenderness.  Diffuse erythema of the medial right forearm extending from the elbow to the proximal wrist.  Mild to moderate edema and induration proximally.  No fluctuance  Neurological: He is alert and oriented to person, place, and time. No sensory deficit. He exhibits normal muscle tone. Coordination normal.  Skin:  Skin is warm and dry. Capillary refill takes less than 2 seconds. There is erythema.  Nursing note and vitals reviewed.            ED Treatments / Results  Labs (all labs ordered are listed, but only abnormal results are displayed) Labs Reviewed  CBC WITH DIFFERENTIAL/PLATELET - Abnormal; Notable for the following:       Result Value   WBC 18.1 (*)    Neutro Abs 15.4 (*)    All other components within normal limits  BASIC METABOLIC PANEL - Abnormal;  Notable for the following:    Glucose, Bld 105 (*)    All other components within normal limits  CULTURE, BLOOD (ROUTINE X 2)  LACTIC ACID, PLASMA    EKG  EKG Interpretation None       Radiology Ct Extrem Up Entire Arm R W/cm  Result Date: 10/11/2016 CLINICAL DATA:  Right upper extremity swelling x1 week with swelling and erythema more so of the forearm. EXAM: CT OF THE UPPER RIGHT EXTREMITY WITH CONTRAST TECHNIQUE: Multidetector CT imaging of the upper right extremity was performed according to the standard protocol following intravenous contrast administration. COMPARISON:  None. CONTRAST:  100mL ISOVUE-300 IOPAMIDOL (ISOVUE-300) INJECTION 61%<Contrast>18400mL ISOVUE-300 IOPAMIDOL (ISOVUE-300) INJECTION 61% FINDINGS: Bones/Joint/Cartilage Negative for acute fracture, dislocation or joint effusions. Subchondral cystic change of the capitellum may be related to old remote trauma, osteochondritis dissecans or osteoarthritis potentially. No frank bone destruction to suggest osteomyelitis. Carpal rows are maintained. Ligaments Suboptimally assessed by CT. Muscles and Tendons Superficial enhancing 7.7 cm in length x 3.3 cm transverse x 3.7 cm AP intramuscular fluid collection consistent with an intramuscular abscesses and/or pyomyositis starting just medial to the elbow joint with what appear to be involvement of the flexor carpi ulnaris and flexor digitorum profundus muscles for length of 7.7 cm, index images on series 8, image 69 and series 7 image 25. Soft tissues Subcutaneous soft tissue induration of the arm and forearm starting from the level of the axilla through distal forearm, more severely affecting the forearm subcutaneous fat and soft tissues. IMPRESSION: 1. Intramuscular abscesses along the medial aspect of the elbow and proximal forearm as above described spanning 7.7 x 3.3 x 3.7 cm, superficially situated in what appear to be the flexor carpi ulnaris and flexor digitorum profundus muscles.  2. Diffuse concomitant right upper extremity cellulitis more markedly affecting the forearm. 3. Subchondral deformity of the capitellum which may be related to old remote trauma, osteochondritis dissecans or osteoarthritis. No acute fracture, joint dislocation or bone destruction. Electronically Signed   By: Tollie Ethavid  Kwon M.D.   On: 10/11/2016 20:46    Procedures Procedures (including critical care time)  Medications Ordered in ED Medications  vancomycin (VANCOCIN) IVPB 1000 mg/200 mL premix (1,000 mg Intravenous New Bag/Given 10/11/16 1903)  HYDROmorphone (DILAUDID) injection 1 mg (1 mg Intravenous Given 10/11/16 1903)  sodium chloride 0.9 % bolus 1,000 mL (1,000 mLs Intravenous New Bag/Given 10/11/16 1932)  HYDROmorphone (DILAUDID) injection 1 mg (1 mg Intravenous Given 10/11/16 1933)     Initial Impression / Assessment and Plan / ED Course  I have reviewed the triage vital signs and the nursing notes.  Pertinent labs & imaging results that were available during my care of the patient were reviewed by me and considered in my medical decision making (see chart for details).     CRITICAL CARE Performed by: Markeita Alicia L. Total critical care time: 30  minutes Critical care time was exclusive of separately billable  procedures and treating other patients. Critical care was necessary to treat or prevent imminent or life-threatening deterioration. Critical care was time spent personally by me on the following activities: development of treatment plan with patient and/or surrogate as well as nursing, discussions with consultants, evaluation of patient's response to treatment, examination of patient, obtaining history from patient or surrogate, ordering and performing treatments and interventions, ordering and review of laboratory studies, ordering and review of radiographic studies, pulse oximetry and re-evaluation of patient's condition.  Pt also seen by Dr. Ranae Palms and care plan discussed.   Will repeat labs and obtain CT of the extremity  2100  Consult Dr. Romeo Apple. Asked that pt be admitted by hospitalist and he will consult.   2120  Consulted Dr. Conley Rolls who agrees to admit.     Final Clinical Impressions(s) / ED Diagnoses   Final diagnoses:  Arm swelling  Cellulitis of right upper extremity  Abscess of upper extremity    New Prescriptions New Prescriptions   No medications on file     Rosey Bath 10/11/16 2148    Loren Racer, MD 10/14/16 [phone]

## 2016-10-11 NOTE — H&P (Signed)
History and Physical    Paul LungerJacob E Petraglia AVW:[id number] DOB: 01/27/1991 DOA: 10/11/2016  PCP: Patient, No Pcp Per  Patient coming from: Home.    Chief Complaint:   Right arm pain and swelling.   HPI: Paul Mckee is an 26 y.o. male with hx of cellulitis of his right forearm, declined admission on the 15th, returned today as he was having increase swelling, redness and pain.  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  He does smoke cigarettes.  There is no hx of DM.  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  Work up included a CT which showed superficial abscess measuring 7.7cmx3.3 cmx 3.7 cm, involving the muscle.  Dr Romeo AppleHarrison was consulted, and deferred admission to hospitalist. He has a leukocytosis with WBC of 18K, normal renal and LFTs.   ED Course:  See above.  Rewiew of Systems:  Constitutional: Negative for malaise, fever and chills. No significant weight loss or weight gain Eyes: Negative for eye pain, redness and discharge, diplopia, visual changes, or flashes of light. ENMT: Negative for ear pain, hoarseness, nasal congestion, sinus pressure and sore throat. No headaches; tinnitus, drooling, or problem swallowing. Cardiovascular: Negative for chest pain, palpitations, diaphoresis, dyspnea and peripheral edema. ; No orthopnea, PND Respiratory: Negative for cough, hemoptysis, wheezing and stridor. No pleuritic chestpain. Gastrointestinal: Negative for diarrhea, constipation,  melena, blood in stool, hematemesis, jaundice and rectal bleeding.    Genitourinary: Negative for frequency, dysuria, incontinence,flank pain and hematuria; Musculoskeletal: Negative for back pain and neck pain.  Skin: . Negative for pruritus, rash, abrasions, bruising and skin lesion.; ulcerations Neuro: Negative for headache, lightheadedness and neck stiffness. Negative for weakness, altered level of consciousness , altered mental status, extremity weakness, burning  feet, involuntary movement, seizure and syncope.  Psych: negative for anxiety, depression, insomnia, tearfulness, panic attacks, hallucinations, paranoia, suicidal or homicidal ideation   History reviewed. No pertinent surgical history.   reports that he has been smoking Cigarettes.  He has been smoking about 1.00 pack per day. He has never used smokeless tobacco. He reports that he does not drink alcohol or use drugs.  No Known Allergies  History reviewed. No pertinent family history.   Prior to Admission medications   Medication Sig Start Date End Date Taking? Authorizing Provider  ibuprofen (ADVIL,MOTRIN) 200 MG tablet Take 800 mg by mouth every 8 (eight) hours as needed for headache or moderate pain.   Yes [provider]  sulfamethoxazole-trimethoprim (BACTRIM DS,SEPTRA DS) 800-160 MG tablet Take 1 tablet by mouth 2 (two) times daily. 10/08/16 10/15/16 Yes Bethann BerkshireZammit, Joseph, MD  traMADol (ULTRAM) 50 MG tablet Take 1 tablet (50 mg total) by mouth every 6 (six) hours as needed. Patient not taking: Reported on 10/11/2016 10/08/16   Bethann BerkshireZammit, Joseph, MD    Physical Exam: Vitals:   10/11/16 1638 10/11/16 1639 10/11/16 2111  BP:  131/80 103/64  Pulse:  (!) 129 98  Resp:  16 18  Temp:  100.3 F (37.9 C) 98.6 F (37 C)  TempSrc:  Oral Oral  SpO2:  98% 99%  Weight: 68 kg (150 lb)    Height: 5\' 8"  (1.727 m)        Constitutional: NAD, calm, comfortable Vitals:   10/11/16 1638 10/11/16 1639 10/11/16 2111  BP:  131/80 103/64  Pulse:  (!) 129 98  Resp:  16 18  Temp:  100.3 F (37.9 C) 98.6 F (37 C)  TempSrc:  Oral  Oral  SpO2:  98% 99%  Weight: 68 kg (150 lb)    Height: 5\' 8"  (1.727 m)     Eyes: PERRL, lids and conjunctivae normal ENMT: Mucous membranes are moist. Posterior pharynx clear of any exudate or lesions.Normal dentition.  Neck: normal, supple, no masses, no thyromegaly Respiratory: clear to auscultation bilaterally, no wheezing, no crackles. Normal respiratory  effort. No accessory muscle use.  Cardiovascular: Regular rate and rhythm, no murmurs / rubs / gallops. No extremity edema. 2+ pedal pulses. No carotid bruits.  Abdomen: no tenderness, no masses palpated. No hepatosplenomegaly. Bowel sounds positive.  Musculoskeletal: no clubbing / cyanosis. No joint deformity upper and lower extremities. Good ROM, no contractures. Normal muscle tone.  Skin: he has erythema over the right arm, and tense medial forearm.  No sensory loss or any neurological deficit.  Good distal pulses.  Neurologic: CN 2-12 grossly intact. Sensation intact, DTR normal. Strength 5/5 in all 4.  Psychiatric: Normal judgment and insight. Alert and oriented x 3. Normal mood.     Labs on Admission: I have personally reviewed following labs and imaging studies  CBC:  Recent Labs Lab 10/08/16 1505 10/11/16 1840  WBC 19.9* 18.1*  NEUTROABS 16.3* 15.4*  HGB 15.6 14.4  HCT 45.8 42.5  MCV 89.3 90.2  PLT 280 345   Basic Metabolic Panel:  Recent Labs Lab 10/08/16 1505 10/11/16 1840  NA 140 136  K 4.1 4.4  CL 101 101  CO2 28 25  GLUCOSE 108* 105*  BUN 8 12  CREATININE 0.81 0.98  CALCIUM 9.9 9.3   GFR: Estimated Creatinine Clearance: 110.8 mL/min (by C-G formula based on SCr of 0.98 mg/dL). Liver Function Tests:  Recent Labs Lab 10/08/16 1509  AST 28  ALT 73*  ALKPHOS 93  BILITOT 0.8  PROT 8.4*  ALBUMIN 4.1  Urine analysis:    Component Value Date/Time   COLORURINE YELLOW 08/29/2010 1507   APPEARANCEUR CLEAR 08/29/2010 1507   LABSPEC 1.005 08/29/2010 1507   PHURINE 7.5 08/29/2010 1507   GLUCOSEU NEGATIVE 08/29/2010 1507   HGBUR NEGATIVE 08/29/2010 1507   BILIRUBINUR NEGATIVE 08/29/2010 1507   KETONESUR NEGATIVE 08/29/2010 1507   PROTEINUR NEGATIVE 08/29/2010 1507   UROBILINOGEN 0.2 08/29/2010 1507   NITRITE NEGATIVE 08/29/2010 1507   LEUKOCYTESUR  08/29/2010 1507    NEGATIVE MICROSCOPIC NOT DONE ON URINES WITH NEGATIVE PROTEIN, BLOOD, LEUKOCYTES,  NITRITE, OR GLUCOSE <1000 mg/dL.    Recent Results (from the past 240 hour(s))  Blood culture (routine x 2)     Status: None (Preliminary result)   Collection Time: 10/08/16  4:31 PM  Result Value Ref Range Status   Specimen Description BLOOD LEFT HAND  Final   Special Requests Blood Culture adequate volume  Final   Culture NO GROWTH 3 DAYS  Final   Report Status PENDING  Incomplete  Blood culture (routine x 2)     Status: None (Preliminary result)   Collection Time: 10/08/16  4:41 PM  Result Value Ref Range Status   Specimen Description BLOOD RIGHT HAND  Final   Special Requests Blood Culture adequate volume  Final   Culture NO GROWTH 3 DAYS  Final   Report Status PENDING  Incomplete  Culture, blood (Routine X 2) w Reflex to ID Panel     Status: None (Preliminary result)   Collection Time: 10/11/16  6:40 PM  Result Value Ref Range Status   Specimen Description BLOOD LEFT HAND  Final   Special Requests   Final  BOTTLES DRAWN AEROBIC AND ANAEROBIC Blood Culture adequate volume   Culture PENDING  Incomplete   Report Status PENDING  Incomplete     Radiological Exams on Admission: Ct Extrem Up Entire Arm R W/cm  Result Date: 10/11/2016 CLINICAL DATA:  Right upper extremity swelling x1 week with swelling and erythema more so of the forearm. EXAM: CT OF THE UPPER RIGHT EXTREMITY WITH CONTRAST TECHNIQUE: Multidetector CT imaging of the upper right extremity was performed according to the standard protocol following intravenous contrast administration. COMPARISON:  None. CONTRAST:  ISOVUE-300 IOPAMIDOL (ISOVUE-300) INJECTION 61%<Contrast>112mL ISOVUE-300 IOPAMIDOL (ISOVUE-300) INJECTION 61% FINDINGS: Bones/Joint/Cartilage Negative for acute fracture, dislocation or joint effusions. Subchondral cystic change of the capitellum may be related to old remote trauma, osteochondritis dissecans or osteoarthritis potentially. No frank bone destruction to suggest osteomyelitis. Carpal rows are  maintained. Ligaments Suboptimally assessed by CT. Muscles and Tendons Superficial enhancing 7.7 cm in length x 3.3 cm transverse x 3.7 cm AP intramuscular fluid collection consistent with an intramuscular abscesses and/or pyomyositis starting just medial to the elbow joint with what appear to be involvement of the flexor carpi ulnaris and flexor digitorum profundus muscles for length of 7.7 cm, index images on series 8, image 69 and series 7 image 25. Soft tissues Subcutaneous soft tissue induration of the arm and forearm starting from the level of the axilla through distal forearm, more severely affecting the forearm subcutaneous fat and soft tissues. IMPRESSION: 1. Intramuscular abscesses along the medial aspect of the elbow and proximal forearm as above described spanning 7.7 x 3.3 x 3.7 cm, superficially situated in what appear to be the flexor carpi ulnaris and flexor digitorum profundus muscles. 2. Diffuse concomitant right upper extremity cellulitis more markedly affecting the forearm. 3. Subchondral deformity of the capitellum which may be related to old remote trauma, osteochondritis dissecans or osteoarthritis. No acute fracture, joint dislocation or bone destruction. Electronically Signed   By: Tollie Eth M.D.   On: 10/11/2016 20:46    EKG: Independently reviewed.   Assessment/Plan Principal Problem:   Abscess of forearm, right Active Problems:   Tobacco abuse   PLAN:   Right forearm abscess:  Will continue with IV Zenaida Niece and add IV Rocephin to his regimen.  Made NPO for ortho evaluation for I and D.  Give IVF, along with IV narcotics.    Tobacco abuse: Advised stop.  DVT prophylaxis: subQ Heparin.  Code Status: full code.  Family Communication: fiance at bedside.  Disposition Plan: home.  Consults called: orthopedics.  Admission status: inpatient.    Kamen Hanken MD FACP. [facility]  If 7PM-7AM, please contact night-coverage [email or web address] Password [facility]  10/11/2016, 9:40  PM

## 2016-10-12 ENCOUNTER — Encounter (HOSPITAL_COMMUNITY): Payer: Self-pay | Admitting: *Deleted

## 2016-10-12 ENCOUNTER — Encounter (HOSPITAL_COMMUNITY)
Admission: EM | Disposition: A | Discharge status: Home / Self Care | Payer: Self-pay | Source: Home / Self Care | Attending: Internal Medicine

## 2016-10-12 ENCOUNTER — Inpatient Hospital Stay (HOSPITAL_COMMUNITY): Payer: Medicaid Other | Admitting: Anesthesiology

## 2016-10-12 ENCOUNTER — Inpatient Hospital Stay (HOSPITAL_COMMUNITY): Payer: Medicaid Other

## 2016-10-12 DIAGNOSIS — L02413 Cutaneous abscess of right upper limb: Secondary | ICD-10-CM

## 2016-10-12 DIAGNOSIS — M7989 Other specified soft tissue disorders: Secondary | ICD-10-CM

## 2016-10-12 DIAGNOSIS — F1910 Other psychoactive substance abuse, uncomplicated: Secondary | ICD-10-CM

## 2016-10-12 DIAGNOSIS — Z22322 Carrier or suspected carrier of Methicillin resistant Staphylococcus aureus: Secondary | ICD-10-CM

## 2016-10-12 DIAGNOSIS — L02419 Cutaneous abscess of limb, unspecified: Secondary | ICD-10-CM

## 2016-10-12 DIAGNOSIS — F17200 Nicotine dependence, unspecified, uncomplicated: Secondary | ICD-10-CM

## 2016-10-12 DIAGNOSIS — Z720 Tobacco use: Secondary | ICD-10-CM

## 2016-10-12 DIAGNOSIS — F1990 Other psychoactive substance use, unspecified, uncomplicated: Secondary | ICD-10-CM

## 2016-10-12 DIAGNOSIS — R030 Elevated blood-pressure reading, without diagnosis of hypertension: Secondary | ICD-10-CM | POA: Diagnosis present

## 2016-10-12 DIAGNOSIS — L03113 Cellulitis of right upper limb: Secondary | ICD-10-CM

## 2016-10-12 HISTORY — PX: INCISION AND DRAINAGE ABSCESS: SHX5864

## 2016-10-12 LAB — SURGICAL PCR SCREEN
MRSA, PCR: POSITIVE — AB
STAPHYLOCOCCUS AUREUS: POSITIVE — AB

## 2016-10-12 LAB — SEDIMENTATION RATE: SED RATE: 29 mm/h — AB (ref 0–16)

## 2016-10-12 SURGERY — INCISION AND DRAINAGE, ABSCESS
Anesthesia: General | Laterality: Right

## 2016-10-12 MED ORDER — SUFENTANIL CITRATE 50 MCG/ML IV SOLN
INTRAVENOUS | Status: DC | PRN
  Administered 2016-10-12 (×2): 10 ug via INTRAVENOUS

## 2016-10-12 MED ORDER — FENTANYL CITRATE (PF) 100 MCG/2ML IJ SOLN: INTRAMUSCULAR | Status: DC | PRN

## 2016-10-12 MED ORDER — LIDOCAINE HCL (PF) 1 % IJ SOLN
INTRAMUSCULAR | Status: AC
  Filled 2016-10-12: qty 5

## 2016-10-12 MED ORDER — MORPHINE SULFATE (PF) 2 MG/ML IV SOLN
2.0000 mg | INTRAVENOUS | Status: DC | PRN
  Administered 2016-10-12 – 2016-10-13 (×4): 2 mg via INTRAVENOUS
  Filled 2016-10-12 (×5): qty 1

## 2016-10-12 MED ORDER — OXYCODONE-ACETAMINOPHEN 5-325 MG PO TABS
1.0000 | ORAL_TABLET | ORAL | Status: DC | PRN
  Administered 2016-10-12 – 2016-10-13 (×6): 1 via ORAL
  Filled 2016-10-12 (×5): qty 1

## 2016-10-12 MED ORDER — HYDROCODONE-ACETAMINOPHEN 5-325 MG PO TABS: 1.0000 | ORAL_TABLET | ORAL | Status: DC | PRN

## 2016-10-12 MED ORDER — SUFENTANIL CITRATE 50 MCG/ML IV SOLN
INTRAVENOUS | Status: AC
  Filled 2016-10-12: qty 1

## 2016-10-12 MED ORDER — GLYCOPYRROLATE 0.2 MG/ML IJ SOLN
INTRAMUSCULAR | Status: DC | PRN
  Administered 2016-10-12: 0.2 mg via INTRAVENOUS
  Administered 2016-10-12: .6 mg via INTRAVENOUS

## 2016-10-12 MED ORDER — FENTANYL CITRATE (PF) 100 MCG/2ML IJ SOLN
INTRAMUSCULAR | Status: DC | PRN
  Administered 2016-10-12 (×5): 50 ug via INTRAVENOUS

## 2016-10-12 MED ORDER — SODIUM CHLORIDE 0.9 % IR SOLN
Status: DC | PRN
  Administered 2016-10-12: 1000 mL

## 2016-10-12 MED ORDER — GLYCOPYRROLATE 0.2 MG/ML IJ SOLN
INTRAMUSCULAR | Status: AC
  Filled 2016-10-12: qty 1

## 2016-10-12 MED ORDER — CLONIDINE HCL 0.1 MG PO TABS
0.1000 mg | ORAL_TABLET | Freq: Two times a day (BID) | ORAL | Status: DC
  Filled 2016-10-12: qty 1

## 2016-10-12 MED ORDER — NALOXONE HCL 0.4 MG/ML IJ SOLN
INTRAMUSCULAR | Status: DC | PRN
  Administered 2016-10-12: 0.1 mg via INTRAVENOUS

## 2016-10-12 MED ORDER — GADOBENATE DIMEGLUMINE 529 MG/ML IV SOLN
14.0000 mL | Freq: Once | INTRAVENOUS | Status: AC | PRN
  Administered 2016-10-12: 14 mL via INTRAVENOUS

## 2016-10-12 MED ORDER — POVIDONE-IODINE 10 % EX SWAB: 2.0000 "application " | Freq: Once | CUTANEOUS | Status: DC

## 2016-10-12 MED ORDER — CHLORHEXIDINE GLUCONATE 4 % EX LIQD: 60.0000 mL | Freq: Once | CUTANEOUS | Status: DC

## 2016-10-12 MED ORDER — OXYCODONE-ACETAMINOPHEN 5-325 MG PO TABS
ORAL_TABLET | ORAL | Status: AC
  Filled 2016-10-12: qty 1

## 2016-10-12 MED ORDER — GLYCOPYRROLATE 0.2 MG/ML IJ SOLN
INTRAMUSCULAR | Status: AC
  Filled 2016-10-12: qty 2

## 2016-10-12 MED ORDER — PROPOFOL 10 MG/ML IV BOLUS
INTRAVENOUS | Status: DC | PRN
  Administered 2016-10-12: 150 mg via INTRAVENOUS
  Administered 2016-10-12 (×2): 50 mg via INTRAVENOUS

## 2016-10-12 MED ORDER — ROCURONIUM BROMIDE 50 MG/5ML IV SOLN
INTRAVENOUS | Status: AC
  Filled 2016-10-12: qty 1

## 2016-10-12 MED ORDER — MUPIROCIN 2 % EX OINT: TOPICAL_OINTMENT | Freq: Two times a day (BID) | CUTANEOUS | Status: DC

## 2016-10-12 MED ORDER — CHLORHEXIDINE GLUCONATE 4 % EX LIQD
Freq: Every day | CUTANEOUS | Status: DC
  Filled 2016-10-12: qty 15

## 2016-10-12 MED ORDER — SUCCINYLCHOLINE CHLORIDE 20 MG/ML IJ SOLN
INTRAMUSCULAR | Status: AC
  Filled 2016-10-12: qty 1

## 2016-10-12 MED ORDER — FENTANYL CITRATE (PF) 100 MCG/2ML IJ SOLN
25.0000 ug | INTRAMUSCULAR | Status: DC | PRN
  Administered 2016-10-12 (×2): 50 ug via INTRAVENOUS

## 2016-10-12 MED ORDER — NALOXONE HCL 0.4 MG/ML IJ SOLN
INTRAMUSCULAR | Status: AC
  Filled 2016-10-12: qty 1

## 2016-10-12 MED ORDER — MIDAZOLAM HCL 2 MG/2ML IJ SOLN
1.0000 mg | INTRAMUSCULAR | Status: DC
  Administered 2016-10-12: 2 mg via INTRAVENOUS
  Filled 2016-10-12: qty 2

## 2016-10-12 MED ORDER — LIDOCAINE HCL (CARDIAC) 10 MG/ML IV SOLN
INTRAVENOUS | Status: DC | PRN
  Administered 2016-10-12: 50 mg via INTRAVENOUS

## 2016-10-12 MED ORDER — ROCURONIUM BROMIDE 100 MG/10ML IV SOLN
INTRAVENOUS | Status: DC | PRN
  Administered 2016-10-12: 30 mg via INTRAVENOUS

## 2016-10-12 MED ORDER — FENTANYL CITRATE (PF) 100 MCG/2ML IJ SOLN
INTRAMUSCULAR | Status: AC
  Filled 2016-10-12: qty 2

## 2016-10-12 MED ORDER — FENTANYL CITRATE (PF) 250 MCG/5ML IJ SOLN
INTRAMUSCULAR | Status: AC
  Filled 2016-10-12: qty 5

## 2016-10-12 MED ORDER — HYDROMORPHONE HCL 1 MG/ML IJ SOLN
1.0000 mg | INTRAMUSCULAR | Status: DC | PRN
Start: 2016-10-12 — End: 2016-10-12
  Administered 2016-10-12 (×5): 1 mg via INTRAVENOUS
  Filled 2016-10-12 (×3): qty 1

## 2016-10-12 MED ORDER — PROPOFOL 10 MG/ML IV BOLUS
INTRAVENOUS | Status: AC
  Filled 2016-10-12: qty 20

## 2016-10-12 MED ORDER — MIDAZOLAM HCL 2 MG/2ML IJ SOLN
INTRAMUSCULAR | Status: AC
  Filled 2016-10-12: qty 2

## 2016-10-12 MED ORDER — HYDROMORPHONE HCL 1 MG/ML IJ SOLN
INTRAMUSCULAR | Status: AC
  Administered 2016-10-12: 1 mg via INTRAVENOUS
  Filled 2016-10-12: qty 1

## 2016-10-12 MED ORDER — SODIUM CHLORIDE 0.9 % IJ SOLN
INTRAMUSCULAR | Status: AC
  Filled 2016-10-12: qty 10

## 2016-10-12 MED ORDER — NEOSTIGMINE METHYLSULFATE 10 MG/10ML IV SOLN
INTRAVENOUS | Status: AC
  Filled 2016-10-12: qty 1

## 2016-10-12 MED ORDER — KETOROLAC TROMETHAMINE 30 MG/ML IJ SOLN
30.0000 mg | Freq: Four times a day (QID) | INTRAMUSCULAR | Status: DC | PRN
  Administered 2016-10-13: 30 mg via INTRAVENOUS
  Filled 2016-10-12: qty 1

## 2016-10-12 MED ORDER — GLYCOPYRROLATE 0.2 MG/ML IJ SOLN
INTRAMUSCULAR | Status: AC
  Filled 2016-10-12: qty 3

## 2016-10-12 MED ORDER — LACTATED RINGERS IV SOLN
INTRAVENOUS | Status: DC
  Administered 2016-10-12: 12:00:00 via INTRAVENOUS

## 2016-10-12 MED ORDER — NICOTINE 14 MG/24HR TD PT24
14.0000 mg | MEDICATED_PATCH | Freq: Every day | TRANSDERMAL | Status: DC
  Administered 2016-10-12: 14 mg via TRANSDERMAL
  Filled 2016-10-12 (×2): qty 1

## 2016-10-12 MED ORDER — NEOSTIGMINE METHYLSULFATE 10 MG/10ML IV SOLN
INTRAVENOUS | Status: DC | PRN
  Administered 2016-10-12: 3 mg via INTRAVENOUS

## 2016-10-12 MED ORDER — SODIUM CHLORIDE 0.9 % IV SOLN: INTRAVENOUS | Status: DC

## 2016-10-12 SURGICAL SUPPLY — 32 items
BAG HAMPER (MISCELLANEOUS) ×3 IMPLANT
BANDAGE ACE 3X5.8 VEL STRL LF (GAUZE/BANDAGES/DRESSINGS) ×3 IMPLANT
BANDAGE ACE 4X5 VEL STRL LF (GAUZE/BANDAGES/DRESSINGS) ×3 IMPLANT
BANDAGE COBAN STERILE 2 (GAUZE/BANDAGES/DRESSINGS) IMPLANT
BANDAGE ESMARK 4X12 BL STRL LF (DISPOSABLE) ×1 IMPLANT
BNDG CONFORM 2 STRL LF (GAUZE/BANDAGES/DRESSINGS) ×3 IMPLANT
BNDG ESMARK 4X12 BLUE STRL LF (DISPOSABLE) ×3
CLOTH BEACON ORANGE TIMEOUT ST (SAFETY) ×3 IMPLANT
COVER LIGHT HANDLE STERIS (MISCELLANEOUS) ×6 IMPLANT
DRAPE ORTHO 2.5IN SPLIT 77X108 (DRAPES) IMPLANT
DRAPE ORTHO SPLIT 77X108 STRL (DRAPES)
ELECT REM PT RETURN 9FT ADLT (ELECTROSURGICAL) ×3
ELECTRODE REM PT RTRN 9FT ADLT (ELECTROSURGICAL) ×1 IMPLANT
GAUZE KERLIX 2X3 DERM STRL LF (GAUZE/BANDAGES/DRESSINGS) ×3 IMPLANT
GAUZE SPONGE 4X4 12PLY STRL (GAUZE/BANDAGES/DRESSINGS) IMPLANT
GLOVE BIOGEL PI IND STRL 7.0 (GLOVE) ×1 IMPLANT
GLOVE BIOGEL PI INDICATOR 7.0 (GLOVE) ×2
GLOVE OPTIFIT SS 8.0 STRL (GLOVE) ×3 IMPLANT
GLOVE SKINSENSE NS SZ8.0 LF (GLOVE) ×2
GLOVE SKINSENSE STRL SZ8.0 LF (GLOVE) ×1 IMPLANT
GOWN STRL REUS W/TWL XL LVL3 (GOWN DISPOSABLE) ×3 IMPLANT
INST SET MINOR BONE (KITS) ×3 IMPLANT
KIT ROOM TURNOVER APOR (KITS) ×3 IMPLANT
MANIFOLD NEPTUNE II (INSTRUMENTS) ×3 IMPLANT
MARKER SKIN DUAL TIP RULER LAB (MISCELLANEOUS) ×3 IMPLANT
NS IRRIG 1000ML POUR BTL (IV SOLUTION) ×3 IMPLANT
PACK BASIC LIMB (CUSTOM PROCEDURE TRAY) ×3 IMPLANT
PAD ABD 5X9 TENDERSORB (GAUZE/BANDAGES/DRESSINGS) IMPLANT
PAD ARMBOARD 7.5X6 YLW CONV (MISCELLANEOUS) ×3 IMPLANT
SET BASIN LINEN APH (SET/KITS/TRAYS/PACK) ×3 IMPLANT
SPONGE GAUZE 4X4 12PLY STER LF (GAUZE/BANDAGES/DRESSINGS) ×3 IMPLANT
SYR BULB IRRIGATION 50ML (SYRINGE) ×3 IMPLANT

## 2016-10-12 NOTE — Progress Notes (Signed)
Patient placed on Contact Isolation per Infection Control policy. Patient and family educated on contact isolation, both verbalized understanding, educational handouts provided to patient. Will continue to monitor patient.

## 2016-10-12 NOTE — ED Notes (Signed)
Dr Romeo AppleHarrison in to see pt.  To hold am dose of Heparin.

## 2016-10-12 NOTE — Progress Notes (Signed)
Dr. Gentry FitzKakarakandy paged d/t pt requesting something for pain other than Oxycodone. Waiting for orders.

## 2016-10-12 NOTE — ED Notes (Signed)
Pt ambulated to BR, while in BR, OR nurse Britta MccreedyBarbara came down to get pt for surgery.  Pt ambulated out of dept with OR staff to OR dept with steady gait.  Consent done and sent with pt.

## 2016-10-12 NOTE — Anesthesia Preprocedure Evaluation (Signed)
Anesthesia Evaluation  Patient identified by MRN, date of birth, ID band Patient awake    Reviewed: Allergy & Precautions, NPO status , Patient's Chart, lab work & pertinent test results  Airway Mallampati: II  TM Distance: >3 FB     Dental  (+) Poor Dentition, Dental Advisory Given, Missing, Chipped   Pulmonary Current Smoker,    breath sounds clear to auscultation       Cardiovascular negative cardio ROS   Rhythm:Regular Rate:Normal     Neuro/Psych    GI/Hepatic negative GI ROS, (+)     substance abuse  IV drug use,   Endo/Other    Renal/GU      Musculoskeletal   Abdominal   Peds  Hematology   Anesthesia Other Findings   Reproductive/Obstetrics                             Anesthesia Physical Anesthesia Plan  ASA: II and emergent  Anesthesia Plan: General   Post-op Pain Management:    Induction: Intravenous, Rapid sequence and Cricoid pressure planned  PONV Risk Score and Plan:   Airway Management Planned: Oral ETT  Additional Equipment:   Intra-op Plan:   Post-operative Plan: Extubation in OR  Informed Consent: I have reviewed the patients History and Physical, chart, labs and discussed the procedure including the risks, benefits and alternatives for the proposed anesthesia with the patient or authorized representative who has indicated his/her understanding and acceptance.     Plan Discussed with:   Anesthesia Plan Comments:         Anesthesia Quick Evaluation

## 2016-10-12 NOTE — Consult Note (Signed)
Reason for Consult: Abscess right forearm Referring Physician: Dr. Rockne Menghini, Loletha Grayer  Paul Mckee is an 26 y.o. male.  HPI: 26 year old male came to the ER last week with cellulitis of his right forearm. No history of trauma. Remote history of drug abuse. He was offered admission refused was treated with IV and oral antibiotics. Presented back to the ER on July 18 with increased pain and swelling increased redness right form stiffness right elbow  No MRI was available in the evening so a CT scan was done which showed intramuscular abscess  The patient was treated with IV antibiotics and admitted for possible incision and drainage of his right elbow  He complains of severe dull aching nonradiating pain over the right forearm associated with stiffness of the right elbow and increasing redness over the last week  The patient denies any medical history or previous surgery  He denies any family history of hypertension diabetes or anesthetic complication    Social History:  reports that he has been smoking Cigarettes.  He has been smoking about 1.00 pack per day. He has never used smokeless tobacco. He reports that he does not drink alcohol or use drugs.  Allergies: No Known Allergies  Medications: I have reviewed the patient's current medications.  Results for orders placed or performed during the hospital encounter of 10/11/16 (from the past 48 hour(s))  CBC with Differential     Status: Abnormal   Collection Time: 10/11/16  6:40 PM  Result Value Ref Range   WBC 18.1 (H) 4.0 - 10.5 K/uL   RBC 4.71 4.22 - 5.81 MIL/uL   Hemoglobin 14.4 13.0 - 17.0 g/dL   HCT 42.5 39.0 - 52.0 %   MCV 90.2 78.0 - 100.0 fL   MCH 30.6 26.0 - 34.0 pg   MCHC 33.9 30.0 - 36.0 g/dL   RDW 14.5 11.5 - 15.5 %   Platelets 345 150 - 400 K/uL   Neutrophils Relative % 86 %   Neutro Abs 15.4 (H) 1.7 - 7.7 K/uL   Lymphocytes Relative 10 %   Lymphs Abs 1.9 0.7 - 4.0 K/uL   Monocytes Relative 3 %   Monocytes Absolute 0.6  0.1 - 1.0 K/uL   Eosinophils Relative 1 %   Eosinophils Absolute 0.2 0.0 - 0.7 K/uL   Basophils Relative 0 %   Basophils Absolute 0.0 0.0 - 0.1 K/uL  Basic metabolic panel     Status: Abnormal   Collection Time: 10/11/16  6:40 PM  Result Value Ref Range   Sodium 136 135 - 145 mmol/L   Potassium 4.4 3.5 - 5.1 mmol/L   Chloride 101 101 - 111 mmol/L   CO2 25 22 - 32 mmol/L   Glucose, Bld 105 (H) 65 - 99 mg/dL   BUN 12 6 - 20 mg/dL   Creatinine, Ser 0.98 0.61 - 1.24 mg/dL   Calcium 9.3 8.9 - 10.3 mg/dL   GFR calc non Af Amer >60 >60 mL/min   GFR calc Af Amer >60 >60 mL/min    Comment: (NOTE) The eGFR has been calculated using the CKD EPI equation. This calculation has not been validated in all clinical situations. eGFR's persistently <60 mL/min signify possible Chronic Kidney Disease.    Anion gap 10 5 - 15  Lactic acid, plasma     Status: None   Collection Time: 10/11/16  6:40 PM  Result Value Ref Range   Lactic Acid, Venous 1.5 0.5 - 1.9 mmol/L  Culture, blood (Routine X 2)  w Reflex to ID Panel     Status: None (Preliminary result)   Collection Time: 10/11/16  6:40 PM  Result Value Ref Range   Specimen Description BLOOD LEFT HAND    Special Requests      BOTTLES DRAWN AEROBIC AND ANAEROBIC Blood Culture adequate volume   Culture NO GROWTH < 12 HOURS    Report Status PENDING   Urine rapid drug screen (hosp performed)     Status: Abnormal   Collection Time: 10/11/16  9:11 PM  Result Value Ref Range   Opiates POSITIVE (A) NONE DETECTED   Cocaine NONE DETECTED NONE DETECTED   Benzodiazepines NONE DETECTED NONE DETECTED   Amphetamines NONE DETECTED NONE DETECTED   Tetrahydrocannabinol NONE DETECTED NONE DETECTED   Barbiturates NONE DETECTED NONE DETECTED    Comment:        DRUG SCREEN FOR MEDICAL PURPOSES ONLY.  IF CONFIRMATION IS NEEDED FOR ANY PURPOSE, NOTIFY LAB WITHIN 5 DAYS.        LOWEST DETECTABLE LIMITS FOR URINE DRUG SCREEN Drug Class       Cutoff  (ng/mL) Amphetamine      1000 Barbiturate      200 Benzodiazepine   410 Tricyclics       301 Opiates          300 Cocaine          300 THC              50     Mr Forearm Right W Wo Contrast  Result Date: 10/12/2016 CLINICAL DATA:  Right forearm, redness, pain and swelling since approximately 10/06/2016. The patient was seen in the emergency department a given antibiotics 10/08/2016. EXAM: MRI OF THE RIGHT FOREARM WITHOUT AND WITH CONTRAST TECHNIQUE: Multiplanar, multisequence MR imaging of the right forearm was performed before and after the administration of intravenous contrast. CONTRAST:  14 ml MULTIHANCE GADOBENATE DIMEGLUMINE 529 MG/ML IV SOLN COMPARISON:  CT right upper arm 10/11/2016. FINDINGS: Bones/Joint/Cartilage Normal signal throughout. No evidence of osteomyelitis. No fracture stress change. Ligaments Intact. Muscles and Tendons There is edema and enhancement in the flexor carpi the ulna naris and flexor digitorum superficialis muscles consistent with myositis. No intramuscular fluid collection is identified. Soft tissues Marked subcutaneous edema and enhancement are present about the forearm, worse on the medial side. There is a focal subcutaneous fluid collection causing mass effect on the flexor digitorum superficialis and flexor carpi ulnaris muscles. The collection is septated and measures 8.5 cm craniocaudal by up to 3.2 cm AP by 1.4 cm transverse. Superficial fascia of the adjacent muscles is edematous and enhancing. IMPRESSION: Cellulitis of the right forearm is worse on the medial side where a septated subcutaneous abscess is identified. The abscess abuts the superficial fascia of the flexor digitorum superficialis and flexor carpi ulnaris muscles. Edema and enhancement within the superficial fascia and adjacent muscles is consistent with fasciitis and myositis. Negative for septic joint or osteomyelitis. Electronically Signed   By: Inge Rise M.D.   On: 10/12/2016 09:06    Ct Extrem Up Entire Arm R W/cm  Result Date: 10/11/2016 CLINICAL DATA:  Right upper extremity swelling x1 week with swelling and erythema more so of the forearm. EXAM: CT OF THE UPPER RIGHT EXTREMITY WITH CONTRAST TECHNIQUE: Multidetector CT imaging of the upper right extremity was performed according to the standard protocol following intravenous contrast administration. COMPARISON:  None. CONTRAST:  126m ISOVUE-300 IOPAMIDOL (ISOVUE-300) INJECTION 61%<Contrast>1065mISOVUE-300 IOPAMIDOL (ISOVUE-300) INJECTION 61% FINDINGS: Bones/Joint/Cartilage Negative for  acute fracture, dislocation or joint effusions. Subchondral cystic change of the capitellum may be related to old remote trauma, osteochondritis dissecans or osteoarthritis potentially. No frank bone destruction to suggest osteomyelitis. Carpal rows are maintained. Ligaments Suboptimally assessed by CT. Muscles and Tendons Superficial enhancing 7.7 cm in length x 3.3 cm transverse x 3.7 cm AP intramuscular fluid collection consistent with an intramuscular abscesses and/or pyomyositis starting just medial to the elbow joint with what appear to be involvement of the flexor carpi ulnaris and flexor digitorum profundus muscles for length of 7.7 cm, index images on series 8, image 69 and series 7 image 25. Soft tissues Subcutaneous soft tissue induration of the arm and forearm starting from the level of the axilla through distal forearm, more severely affecting the forearm subcutaneous fat and soft tissues. IMPRESSION: 1. Intramuscular abscesses along the medial aspect of the elbow and proximal forearm as above described spanning 7.7 x 3.3 x 3.7 cm, superficially situated in what appear to be the flexor carpi ulnaris and flexor digitorum profundus muscles. 2. Diffuse concomitant right upper extremity cellulitis more markedly affecting the forearm. 3. Subchondral deformity of the capitellum which may be related to old remote trauma, osteochondritis dissecans  or osteoarthritis. No acute fracture, joint dislocation or bone destruction. Electronically Signed   By: Ashley Royalty M.D.   On: 10/11/2016 20:46    ROS Blood pressure 115/68, pulse 88, temperature 97.7 F (36.5 C), temperature source Oral, resp. rate 14, height 5' 8"  (1.727 m), weight 150 lb (68 kg), SpO2 98 %. Physical Exam  Constitutional: He appears well-developed and well-nourished. No distress.  HENT:  Head: Normocephalic and atraumatic.  Mouth/Throat: No oropharyngeal exudate.  Eyes: Conjunctivae and EOM are normal. Right eye exhibits no discharge. Left eye exhibits no discharge. No scleral icterus.  Neck: Normal range of motion. Neck supple. No JVD present. No tracheal deviation present. No thyromegaly present.  Cardiovascular: Intact distal pulses.   Pulses:      Radial pulses are 2+ on the right side, and 2+ on the left side.  Respiratory: Effort normal. No stridor. No respiratory distress. He has no wheezes.  GI: Soft. He exhibits no distension.  Musculoskeletal:  Right medial forearm large area of erythema and tenderness over this area with fluctuance.  Elbow is stiff. Decreased flexion and extension. Muscle tone is normal Elbow is stable to varus valgus stress test Axilla is negative for tenderness or adenopathy no erythema proximally  His other extremities are normally aligned with normal range of motion stability strength and alignment   Lymphadenopathy:    He has no cervical adenopathy.       Right: No epitrochlear adenopathy present.       Left: No epitrochlear adenopathy present.  Neurological: He is alert. He displays normal reflexes. He exhibits normal muscle tone. Coordination normal.  Skin: Skin is warm. No rash noted. He is not diaphoretic. There is erythema. No pallor.  Psychiatric: He has a normal mood and affect. His behavior is normal. Judgment and thought content normal.    Assessment/Plan: I did an MRI of his forearm and it shows a subcutaneous  abscess above the muscle with myositis in the muscle  I recommended incision and drainage  I explained the procedure to the patient possible complications  He agrees to surgery incision and drainage right forearm  Arther Abbott 10/12/2016, 10:21 AM

## 2016-10-12 NOTE — Op Note (Signed)
10/12/2016  1:19 PM  PATIENT:  Paul Mckee  26 y.o. male  PRE-OPERATIVE DIAGNOSIS:  abscess right forearm  POST-OPERATIVE DIAGNOSIS: same   PROCEDURE:  Procedure(s): INCISION AND DRAINAGE ABSCESS, right  forearm (Right)   Surgical findings 2 loculated areas perhaps with one communication subcutaneous right forearm over the medial flexor mass  Measuring: 8 cm long by 3 cm wide by 1 cm deep (size of wound)  This procedure was done as follows.  Paul KeasJacob Mckee was identified surgical site confirmed marked chart reviewed and he was taken to surgery  He had general anesthesia. He was in the supine position. His right hand was washed first along with his arm and then ChloraPrep is used. After timeout we exsanguinated the limb with an Esmarch elevated the tourniquet to 250 mmHg.  I made a longitudinal incision and it appeared to be 2 separate collections but in any event I had to sacrifice the medial antebrachial cutaneous nerve branch.  Cultures were obtained and thorough irrigation was done until the wound bed looked clean  The muscle tissue was explored there was no purulent material beneath the fascia.  The wound was packed with 1-4 inch saline soaked gauze  Sterile dressings applied over the top with Kerlix and Ace bandage  Tourniquet released fingers have excellent color and capillary refill  Patient extubated taken to postanesthesia care unit in stable condition  SURGEON:  Surgeon(s) and Role:    * Vickki HearingHarrison, Tailer Volkert E, MD - Primary  PHYSICIAN ASSISTANT:   ASSISTANTS: none   ANESTHESIA:   general  EBL:  Total I/O In: 500 [I.V.:500] Out: 0   BLOOD ADMINISTERED:none  DRAINS: none   LOCAL MEDICATIONS USED:  NONE  Specimen anaerobic and aerobic cultures from right forearm  Microbiology asked to identify with a Gram stain culture and sensitivity   COUNTS:  YES  TOURNIQUET:  * No tourniquets in log *  DICTATION: .Dragon Dictation  PLAN OF CARE: Admit to  inpatient   PATIENT DISPOSITION:  PACU - hemodynamically stable.   Delay start of Pharmacological VTE agent (>24hrs) due to surgical blood loss or risk of bleeding: not applicable  [id number]

## 2016-10-12 NOTE — Progress Notes (Signed)
Pt has asked RN numerous times what kind of pain medication he will get prescribed upon discharge tomorrow. Pt adv that he would have to get cleared by Dr. Romeo AppleHarrison and once cleared Dr. Darnelle Catalanama (hospitalist) would write discharge orders. Adv pt there is no way to know what kind of pain medication will be ordered for him to be discharged on and that he will have to speak with MD tomorrow.

## 2016-10-12 NOTE — Progress Notes (Signed)
Late entry from 1940: pt asking to walk off floor and go smoke a cigarette. Rn adv pt that if he leaves floor, he is considered to be leaving AMA and will have to get readmitted through the ED. Rn adv pt that nicotine patch would be given to him. Pt then asked if he could just leave the floor and walk around, RN adv pt that he can not leave the floor but can walk around the unit. Pt verbalized understanding and went walking around floor with girlfriend and baby.

## 2016-10-12 NOTE — ED Notes (Signed)
Dr Rama paged.

## 2016-10-12 NOTE — Brief Op Note (Signed)
10/11/2016 - 10/12/2016  1:19 PM  PATIENT:  Paul Mckee  26 y.o. male  PRE-OPERATIVE DIAGNOSIS:  abscess right forearm  POST-OPERATIVE DIAGNOSIS: same   PROCEDURE:  Procedure(s): INCISION AND DRAINAGE ABSCESS, right  forearm (Right)   Surgical findings 2 loculated areas perhaps with one communication subcutaneous right forearm over the medial flexor mass  Measuring: 8 cm long by 3 cm wide by 1 cm deep (size of wound)  This procedure was done as follows.  Paul KeasJacob Mckee was identified surgical site confirmed marked chart reviewed and he was taken to surgery  He had general anesthesia. He was in the supine position. His right hand was washed first along with his arm and then ChloraPrep is used. After timeout we exsanguinated the limb with an Esmarch elevated the tourniquet to 250 mmHg.  I made a longitudinal incision and it appeared to be 2 separate collections but in any event I had to sacrifice the medial antebrachial cutaneous nerve branch.  Cultures were obtained and thorough irrigation was done until the wound bed looked clean  The muscle tissue was explored there was no purulent material beneath the fascia.  The wound was packed with 1-4 inch saline soaked gauze  Sterile dressings applied over the top with Kerlix and Ace bandage  Tourniquet released fingers have excellent color and capillary refill  Patient extubated taken to postanesthesia care unit in stable condition  SURGEON:  Surgeon(s) and Role:    * Vickki HearingHarrison, Avon Mergenthaler E, MD - Primary  PHYSICIAN ASSISTANT:   ASSISTANTS: none   ANESTHESIA:   general  EBL:  Total I/O In: 500 [I.V.:500] Out: 0   BLOOD ADMINISTERED:none  DRAINS: none   LOCAL MEDICATIONS USED:  NONE  Specimen anaerobic and aerobic cultures from right forearm  Microbiology asked to identify with a Gram stain culture and sensitivity   COUNTS:  YES  TOURNIQUET:  * No tourniquets in log *  DICTATION: .Dragon Dictation  PLAN OF  CARE: Admit to inpatient   PATIENT DISPOSITION:  PACU - hemodynamically stable.   Delay start of Pharmacological VTE agent (>24hrs) due to surgical blood loss or risk of bleeding: not applicable  [id number]

## 2016-10-12 NOTE — Transfer of Care (Signed)
Immediate Anesthesia Transfer of Care Note  Patient: Paul Mckee  Procedure(s) Performed: Procedure(s): INCISION AND DRAINAGE ABSCESS, right  forearm (Right)  Patient Location: PACU  Anesthesia Type:General  Level of Consciousness: awake and patient cooperative  Airway & Oxygen Therapy: Patient Spontanous Breathing and non-rebreather face mask  Post-op Assessment: Report given to RN and Post -op Vital signs reviewed and stable  Post vital signs: Reviewed and stable  Last Vitals:  Vitals:   10/12/16 1118 10/12/16 1150  BP: 125/75   Pulse:  81  Resp:  17  Temp:  36.9 C    Last Pain:  Vitals:   10/12/16 1150  TempSrc: Oral  PainSc: 9          Complications: No apparent anesthesia complications

## 2016-10-12 NOTE — Anesthesia Postprocedure Evaluation (Signed)
Anesthesia Post Note  Patient: Deno LungerJacob E Schlossberg  Procedure(s) Performed: Procedure(s) (LRB): INCISION AND DRAINAGE ABSCESS, right  forearm (Right)  Anesthesia Type: General Level of consciousness: awake and alert, oriented and patient cooperative Pain management: pain level controlled Vital Signs Assessment: post-procedure vital signs reviewed and stable Respiratory status: spontaneous breathing and respiratory function stable Cardiovascular status: stable Anesthetic complications: no     Last Vitals:  Vitals:   10/12/16 1150 10/12/16 1347  BP:  (!) 154/117  Pulse: 81   Resp: 17 20  Temp: 36.9 C 36.5 C    Last Pain:  Vitals:   10/12/16 1347  TempSrc:   PainSc: 10-Worst pain ever                 ADAMS, AMY A

## 2016-10-12 NOTE — Anesthesia Procedure Notes (Signed)
Procedure Name: Intubation Date/Time: 10/12/2016 12:45 PM Performed by: Vista Deck Pre-anesthesia Checklist: Patient identified, Patient being monitored, Timeout performed, Emergency Drugs available and Suction available Patient Re-evaluated:Patient Re-evaluated prior to induction Oxygen Delivery Method: Circle System Utilized Preoxygenation: Pre-oxygenation with 100% oxygen Induction Type: IV induction, Rapid sequence and Cricoid Pressure applied Ventilation: Mask ventilation without difficulty Laryngoscope Size: Mac and 4 Grade View: Grade II Tube type: Oral Tube size: 7.0 mm Number of attempts: 1 Airway Equipment and Method: stylet and Oral airway Placement Confirmation: ETT inserted through vocal cords under direct vision,  positive ETCO2 and breath sounds checked- equal and bilateral Secured at: 22 cm Tube secured with: Tape Dental Injury: Teeth and Oropharynx as per pre-operative assessment

## 2016-10-12 NOTE — Progress Notes (Signed)
Progress Note    LYDON VANSICKLE  UEA:[id number] DOB: 1990-05-29  DOA: 10/11/2016 PCP: Patient, No Pcp Per    Brief Narrative:   Chief complaint: Follow-up cellulitis  Medical records reviewed and are as summarized below:  BRANSEN FASSNACHT is an 26 y.o. male with a PMH of IV drug abuse, tobacco abuse who initially presented 10/08/16 for evaluation of right forearm cellulitis. He declined admission at that time, and was sent out on Bactrim. He returned to the hospital 10/11/16 with nonresolution of cellulitis. CT of the right arm showed a 7. 7X3.3X 3.7 cm intramuscular abscess cellulitis. Hand surgery subsequently consulted.  Assessment/Plan:   Principal Problem:   Abscess of forearm, right Placed on empiric vancomycin and Rocephin. S/P I&D, follow up operative cultures.  Blood cultures sent. NPO. Monitor renal function and watch for nephrotoxicity while on Vancomycin.  Active Problems:   Tobacco abuse Tobacco cessation counseling.    IVDU (intravenous drug user) He denies recent use however abscess formation indicative of active ongoing use. UDS + opiates. D/C IV dilaudid.  Oxycodone for pain control. SW consult for substance abuse counseling.    HIV screening The patient falls between the ages of 13-64 and has high risk behaviors and should be screened for HIV, therefore HIV testing ordered.    MRSA carrier Contact precautions. Decontamination therapy ordered.    Elevated blood pressure Clonidine ordered.  Family Communication/Anticipated D/C date and plan/Code Status   DVT prophylaxis: Heparin ordered. Code Status: Full Code.  Family Communication: Wife at bedside. Disposition Plan: Home when operative culture/sensitivities available.   Medical Consultants:    Orthopedic Surgery.   Anti-Infectives:    Vancomycin 10/11/16--->  Subjective:   Says his left arm is sore.  No nausea, vomiting, diarrhea, dyspnea.  Objective:    Vitals:   10/12/16 1347  10/12/16 1415 10/12/16 1430 10/12/16 1455  BP: (!) 154/117 (!) 159/103 (!) 160/95 128/79  Pulse:  90 84 80  Resp: 20 16 12 18   Temp: 97.7 F (36.5 C)   98.1 F (36.7 C)  TempSrc:      SpO2: 99% 96% 94% 98%  Weight:      Height:        Intake/Output Summary (Last 24 hours) at 10/12/16 1713 Last data filed at 10/12/16 1645  Gross per 24 hour  Intake          3860.42 ml  Output                0 ml  Net          3860.42 ml   Filed Weights   10/11/16 1638 10/12/16 1150  Weight: 68 kg (150 lb) 68 kg (150 lb)    Exam: General exam: Thin young male who appears mildly sedated, smells of cigarette smoke. Respiratory system: Lungs diminished but clear.  No rales, rhonchi or wheezes. Cardiovascular system: Heart sounds are regular, no murmurs, rubs or gallops. Gastrointestinal system: Abdomen is soft, non-tender, non-distended with normal active bowel sounds. Central nervous system: Mildly sleepy, but oriented x 3.  MAEx4. Grossly normal. Extremities: Right arm wrapped in ace wrap, moves fingers well.  No cyanosis or edema. Skin: Warm and dry.  No rashes. Right arm wound not visualized. Psychiatry: Mood and affect depressed/flat.  Poor insight/judgement.  Data Reviewed:   I have personally reviewed following labs and imaging studies:  Labs: Basic Metabolic Panel:  Recent Labs Lab 10/08/16 1505 10/11/16 1840  NA 140 136  K  4.1 4.4  CL 101 101  CO2 28 25  GLUCOSE 108* 105*  BUN 8 12  CREATININE 0.81 0.98  CALCIUM 9.9 9.3   GFR Estimated Creatinine Clearance: 110.8 mL/min (by C-G formula based on SCr of 0.98 mg/dL). Liver Function Tests:  Recent Labs Lab 10/08/16 1509  AST 28  ALT 73*  ALKPHOS 93  BILITOT 0.8  PROT 8.4*  ALBUMIN 4.1   CBC:  Recent Labs Lab 10/08/16 1505 10/11/16 1840  WBC 19.9* 18.1*  NEUTROABS 16.3* 15.4*  HGB 15.6 14.4  HCT 45.8 42.5  MCV 89.3 90.2  PLT 280 345   Sepsis Labs:  Recent Labs Lab 10/08/16 1505 10/11/16 1840  WBC  19.9* 18.1*  LATICACIDVEN  --  1.5    Microbiology Recent Results (from the past 240 hour(s))  Blood culture (routine x 2)     Status: None (Preliminary result)   Collection Time: 10/08/16  4:31 PM  Result Value Ref Range Status   Specimen Description BLOOD LEFT HAND  Final   Special Requests Blood Culture adequate volume  Final   Culture NO GROWTH 4 DAYS  Final   Report Status PENDING  Incomplete  Blood culture (routine x 2)     Status: None (Preliminary result)   Collection Time: 10/08/16  4:41 PM  Result Value Ref Range Status   Specimen Description BLOOD RIGHT HAND  Final   Special Requests Blood Culture adequate volume  Final   Culture NO GROWTH 4 DAYS  Final   Report Status PENDING  Incomplete  Culture, blood (Routine X 2) w Reflex to ID Panel     Status: None (Preliminary result)   Collection Time: 10/11/16  6:40 PM  Result Value Ref Range Status   Specimen Description BLOOD LEFT HAND  Final   Special Requests   Final    BOTTLES DRAWN AEROBIC AND ANAEROBIC Blood Culture adequate volume   Culture NO GROWTH < 12 HOURS  Final   Report Status PENDING  Incomplete  Surgical pcr screen     Status: Abnormal   Collection Time: 10/12/16 12:13 PM  Result Value Ref Range Status   MRSA, PCR POSITIVE (A) NEGATIVE Final    Comment: RESULT CALLED TO, READ BACK BY AND VERIFIED WITH: DILDY,V ON 10/12/16 AT 1520 BY LOY,C    Staphylococcus aureus POSITIVE (A) NEGATIVE Final    Comment:        The Xpert SA Assay (FDA approved for NASAL specimens in patients over 48 years of age), is one component of a comprehensive surveillance program.  Test performance has been validated by [facility] for patients greater than or equal to 25 year old. It is not intended to diagnose infection nor to guide or monitor treatment. RESULT CALLED TO, READ BACK BY AND VERIFIED WITH: DILDY,V ON 10/12/16 AT 1520 BY LOY,C     Procedures and diagnostic studies:  Mr Forearm Right W Wo Contrast 10/12/2016:  Personally reviewed. Abscess is as pictured.  No evidence of osteomyelitis.  l.     Ct Extrem Up Entire Arm R W/cm 10/11/2016: Personally reviewed. Large abscess as pictured. Subchondral deformity as pictured.   Medications:   . heparin  5,000 Units Subcutaneous Q8H   Continuous Infusions:   Medical decision making is of high complexity and this patient is at high risk of deterioration, therefore this is a level 3 visit.  (> 4 problem points, 3 data points, high risk: Need 2 out of 3)   Problems/DDx Points  Self limited or minor (max 2)       1  1  Established problem, stable       1   Established problem, worsening       2   New problem, no additional W/U planned (max 1)       3  3  New problem, additional W/U planned        4    Data Reviewed Points   Review/order clinical lab tests       1  1  Review/order x-rays       1   Review/order tests (Echo, EKG, PFTs, etc)       1   Discussion of test results w/ performing MD       1   Independent review of image, tracing or specimen       2  2  Decision to obtain old records       1   Review and summation of old records       2    Level of risk Presenting prob Diagnostics Management   Minimal 1 self limited/minor Labs CXR EKG/EEG U/A U/S Rest Gargles Bandages Dressings   Low 2 or more self limited/minor 1 stable chronic Acute uncomplicated illness Tests (PFTS) Non-CV imaging Arterial labs Biopsies of skin OTC drugs Minor surgery-no risk PT OT IVF without additives    Moderate 1 or more chronic illnesses w/ mild exac, progression or S/E from tx 2 or more stable chronic illnesses Undiagnosed new problem w/ uncertain prognosis Acute complicated injury  Stress tests Endoscopies with no risk factors Deep needle or incisional bx CV imaging without risk LP Thoracentesis Paracentesis Minor surgery w/ risks Elective major surgery w/ no risk (open, percutaneous or endoscopic) Prescription drugs Therapeutic nucl  med IVF with additives Closed tx of fracture/dislocation    High Severe exac of chronic illness Acute or chronic illness/injury may pose a threat to life or bodily function (ARF) Change in neuro status    CV imaging w/ contrast and risk Cardio electophysiologic tests Endoscopies w/ risk Discography Elective major surgery Emergency major surgery Parenteral controlled substances Drug therapy req monitoring for toxicity DNR/de-escalation of care    MDM Prob points Data points Risk   Straightforward    <1    <1    Min   Low complexity    2    2    Low   Moderate    3    3    Mod   High Complexity    4 or more    4 or more    High      LOS: 1 day   RAMA,CHRISTINA  [facility] Pager [phone]. If unable to reach me by pager, please call my cell phone at [phone].  *Please refer to [email or web address], password [email or web address] to get updated schedule on who will round on this patient, as hospitalists switch teams weekly. If 7PM-7AM, please contact night-coverage at [email or web address], password [email or web address] for any overnight needs.  10/12/2016, 5:13 PM

## 2016-10-12 NOTE — ED Notes (Signed)
Second paged for Dr Darnelle Catalanama.  Need to verify if patient is going to have procedure that requires holding heparin sq.

## 2016-10-13 DIAGNOSIS — M7989 Other specified soft tissue disorders: Secondary | ICD-10-CM

## 2016-10-13 DIAGNOSIS — L03113 Cellulitis of right upper limb: Secondary | ICD-10-CM

## 2016-10-13 LAB — CULTURE, BLOOD (ROUTINE X 2)
Culture: NO GROWTH
Culture: NO GROWTH
SPECIAL REQUESTS: ADEQUATE
Special Requests: ADEQUATE

## 2016-10-13 LAB — BASIC METABOLIC PANEL
ANION GAP: 7 (ref 5–15)
BUN: 12 mg/dL (ref 6–20)
CALCIUM: 9.2 mg/dL (ref 8.9–10.3)
CO2: 30 mmol/L (ref 22–32)
CREATININE: 0.88 mg/dL (ref 0.61–1.24)
Chloride: 99 mmol/L — ABNORMAL LOW (ref 101–111)
Glucose, Bld: 91 mg/dL (ref 65–99)
Potassium: 4.6 mmol/L (ref 3.5–5.1)
Sodium: 136 mmol/L (ref 135–145)

## 2016-10-13 LAB — CBC
HCT: 43.7 % (ref 39.0–52.0)
HEMOGLOBIN: 14.4 g/dL (ref 13.0–17.0)
MCH: 29.9 pg (ref 26.0–34.0)
MCHC: 33 g/dL (ref 30.0–36.0)
MCV: 90.9 fL (ref 78.0–100.0)
PLATELETS: 329 10*3/uL (ref 150–400)
RBC: 4.81 MIL/uL (ref 4.22–5.81)
RDW: 14.8 % (ref 11.5–15.5)
WBC: 7.1 10*3/uL (ref 4.0–10.5)

## 2016-10-13 LAB — HIV ANTIBODY (ROUTINE TESTING W REFLEX): HIV Screen 4th Generation wRfx: NONREACTIVE

## 2016-10-13 LAB — C-REACTIVE PROTEIN: CRP: 5.4 mg/dL — AB (ref <1.0)

## 2016-10-13 MED ORDER — SULFAMETHOXAZOLE-TRIMETHOPRIM 400-80 MG PO TABS
1.0000 | ORAL_TABLET | Freq: Two times a day (BID) | ORAL | Status: DC
  Filled 2016-10-13 (×7): qty 1

## 2016-10-13 MED ORDER — SULFAMETHOXAZOLE-TRIMETHOPRIM 800-160 MG PO TABS
0.5000 | ORAL_TABLET | Freq: Two times a day (BID) | ORAL | Status: DC
  Administered 2016-10-13: 0.5 via ORAL
  Filled 2016-10-13: qty 1

## 2016-10-13 MED ORDER — SULFAMETHOXAZOLE-TRIMETHOPRIM 800-160 MG PO TABS: 1.0000 | ORAL_TABLET | Freq: Two times a day (BID) | ORAL | 0 refills | Status: AC

## 2016-10-13 MED ORDER — IBUPROFEN 800 MG PO TABS: 800.0000 mg | ORAL_TABLET | Freq: Three times a day (TID) | ORAL | 0 refills | Status: DC | PRN

## 2016-10-13 NOTE — Care Management Note (Signed)
Case Management Note  Patient Details  Name: Paul Mckee MRN: [id number] Date of Birth: 10/01/1990  Subjective/Objective:    Adm with Right forearm wound. Dr. Romeo AppleHarrison would like PT wound care MWF.              Action/Plan: Referral sent to [facility]. Dr. Romeo AppleHarrison notified to co-sign order.   Expected Discharge Date:  10/13/16               Expected Discharge Plan:  Home/Self Care  In-House Referral:     Discharge planning Services  CM Consult  Post Acute Care Choice:    Choice offered to:  NA  DME Arranged:    DME Agency:     HH Arranged:    HH Agency:     Status of Service:  Completed, signed off  If discussed at [employer] Length of Stay Meetings, dates discussed:    Additional Comments:  [name], Chrystine OilerSharley Diane, RN 10/13/2016, 3:02 PM

## 2016-10-13 NOTE — Addendum Note (Signed)
Addendum  created 10/13/16 [id number] by Earleen NewportAdams, Amy A, CRNA   Sign clinical note

## 2016-10-13 NOTE — Progress Notes (Signed)
Patient states he is "not leaving this hospital without pain medication". Patient states he will "call the head board of the hospital and if not given something, he will go home and "shoot up heroin".  Dr. Darnelle Catalanama made aware. RN [facility] contacted.

## 2016-10-13 NOTE — Progress Notes (Signed)
Patient given discharge instructions at bedside, patient has concerns about wound status.  Patient instructed to make follow up appointment with Dr. Romeo AppleHarrison.

## 2016-10-13 NOTE — Discharge Summary (Signed)
Physician Discharge Summary  Paul Mckee ZOX:[id number] DOB: 1990-04-01 DOA: 10/11/2016  PCP: Patient, No Pcp Per  Admit date: 10/11/2016 Discharge date: 10/13/2016  Admitted From: Home Discharge disposition: Home   Recommendations for Outpatient Follow-Up:   1. He will follow up with Dr. Romeo Apple next week and be set up for dressing changes at PT outpatient services Q M/W/F.   Discharge Diagnosis:   Principal Problem:    Abscess of forearm, right Active Problems:    Tobacco abuse    IVDU (intravenous drug user)    MRSA carrier    Elevated blood pressure reading    Arm swelling    Cellulitis of right upper extremity  Discharge Condition: Improved.  Diet recommendation:  Regular.  Wound care: Wet to dry dressings per Dr. Mort Sawyers instructions.   History of Present Illness:   Paul Mckee is an 26 y.o. male with a PMH of IV drug abuse, tobacco abuse who initially presented 10/08/16 for evaluation of right forearm cellulitis. He declined admission at that time, and was sent out on Bactrim. He returned to the hospital 10/11/16 with nonresolution of cellulitis. CT of the right arm showed a 7. 7X3.3X 3.7 cm intramuscular abscess cellulitis. Hand surgery subsequently consulted.  Hospital Course by Problem:   Principal Problem:   Abscess of forearm, right: New, no additional work up Placed on empiric vancomycin and Rocephin on admission. S/P I&D, follow up operative cultures (Preliminary report growing gram-positive cocci in clusters, likely MRSA).  Blood cultures from initial presentation 5 days ago are negative. D/C home on Septra per ortho recommendations.  Treat for 30 days. Outpatient F/U with ortho in 1 week, dressing changes per outpatient PT wet to dry Q M/W/F.  Active Problems:   Tobacco abuse: Ongoing Tobacco cessation counseling provided.    IVDU (intravenous drug user): Ongoing He denies recent use however abscess formation indicative of active  ongoing use. UDS + opiates.  Oxycodone ordered by surgery for pain control during hospital stay.  Will provide a prescription for Motrin 800 mg Q 8 hours PRN, but surgeon will need to write for anything stronger, if deemed necessary. SW consulted for substance abuse counseling.    HIV screening The patient falls between the ages of 13-64 and has high risk behaviors and should be screened for HIV, therefore HIV testing done: Non-reactive.    MRSA carrier: Ongoing Contact precautions. Decontamination therapy ordered.    Elevated blood pressure: Stable BP controlled on Clonidine. Will not continue at discharge.  Needs close outpatient follow up.  Medical Consultants:    Orthopedic Surgery: Vickki Hearing, MD   Discharge Exam:   Vitals:   10/13/16 0620 10/13/16 1357  BP: 110/68 126/65  Pulse: (!) 51 77  Resp: 18 18  Temp: 97.7 F (36.5 C) 98.2 F (36.8 C)   Vitals:   10/12/16 2053 10/12/16 2231 10/13/16 0620 10/13/16 1357  BP:  103/65 110/68 126/65  Pulse:  83 (!) 51 77  Resp:  18 18 18   Temp:  98.9 F (37.2 C) 97.7 F (36.5 C) 98.2 F (36.8 C)  TempSrc:  Oral Oral   SpO2: 98% 99% 99% 98%  Weight:      Height:       Exam:  Reviewed 10/12/16 and unchanged except as noted. General exam: Thin young male who is awake and alert. Respiratory system: Lungs diminished but clear.  No rales, rhonchi or wheezes. Cardiovascular system: Heart sounds are regular, no murmurs, rubs or gallops. Gastrointestinal  system: Abdomen is soft, non-tender, non-distended with normal active bowel sounds. Central nervous system: Awake, but oriented x 3.  MAEx4. Grossly normal. Extremities: Right arm wrapped in ace wrap, moves fingers well.  No cyanosis or edema. Skin: Warm and dry.  No rashes. Right arm wound not visualized. Psychiatry: Mood and affect depressed/flat.  Poor insight/judgement.   The results of significant diagnostics from this hospitalization (including imaging,  microbiology, ancillary and laboratory) are listed below for reference.     Procedures and Diagnostic Studies:   Mr Forearm Right W Wo Contrast  Result Date: 10/12/2016 CLINICAL DATA:  Right forearm, redness, pain and swelling since approximately 10/06/2016. The patient was seen in the emergency department a given antibiotics 10/08/2016. EXAM: MRI OF THE RIGHT FOREARM WITHOUT AND WITH CONTRAST TECHNIQUE: Multiplanar, multisequence MR imaging of the right forearm was performed before and after the administration of intravenous contrast. CONTRAST:  14 ml MULTIHANCE GADOBENATE DIMEGLUMINE 529 MG/ML IV SOLN COMPARISON:  CT right upper arm 10/11/2016. FINDINGS: Bones/Joint/Cartilage Normal signal throughout. No evidence of osteomyelitis. No fracture stress change. Ligaments Intact. Muscles and Tendons There is edema and enhancement in the flexor carpi the ulna naris and flexor digitorum superficialis muscles consistent with myositis. No intramuscular fluid collection is identified. Soft tissues Marked subcutaneous edema and enhancement are present about the forearm, worse on the medial side. There is a focal subcutaneous fluid collection causing mass effect on the flexor digitorum superficialis and flexor carpi ulnaris muscles. The collection is septated and measures 8.5 cm craniocaudal by up to 3.2 cm AP by 1.4 cm transverse. Superficial fascia of the adjacent muscles is edematous and enhancing. IMPRESSION: Cellulitis of the right forearm is worse on the medial side where a septated subcutaneous abscess is identified. The abscess abuts the superficial fascia of the flexor digitorum superficialis and flexor carpi ulnaris muscles. Edema and enhancement within the superficial fascia and adjacent muscles is consistent with fasciitis and myositis. Negative for septic joint or osteomyelitis. Electronically Signed   By: Drusilla Kanner M.D.   On: 10/12/2016 09:06   Ct Extrem Up Entire Arm R W/cm  Result Date:  10/11/2016 CLINICAL DATA:  Right upper extremity swelling x1 week with swelling and erythema more so of the forearm. EXAM: CT OF THE UPPER RIGHT EXTREMITY WITH CONTRAST TECHNIQUE: Multidetector CT imaging of the upper right extremity was performed according to the standard protocol following intravenous contrast administration. COMPARISON:  None. CONTRAST:  ISOVUE-300 IOPAMIDOL (ISOVUE-300) INJECTION 61%<Contrast>173mL ISOVUE-300 IOPAMIDOL (ISOVUE-300) INJECTION 61% FINDINGS: Bones/Joint/Cartilage Negative for acute fracture, dislocation or joint effusions. Subchondral cystic change of the capitellum may be related to old remote trauma, osteochondritis dissecans or osteoarthritis potentially. No frank bone destruction to suggest osteomyelitis. Carpal rows are maintained. Ligaments Suboptimally assessed by CT. Muscles and Tendons Superficial enhancing 7.7 cm in length x 3.3 cm transverse x 3.7 cm AP intramuscular fluid collection consistent with an intramuscular abscesses and/or pyomyositis starting just medial to the elbow joint with what appear to be involvement of the flexor carpi ulnaris and flexor digitorum profundus muscles for length of 7.7 cm, index images on series 8, image 69 and series 7 image 25. Soft tissues Subcutaneous soft tissue induration of the arm and forearm starting from the level of the axilla through distal forearm, more severely affecting the forearm subcutaneous fat and soft tissues. IMPRESSION: 1. Intramuscular abscesses along the medial aspect of the elbow and proximal forearm as above described spanning 7.7 x 3.3 x 3.7 cm, superficially situated in what appear  to be the flexor carpi ulnaris and flexor digitorum profundus muscles. 2. Diffuse concomitant right upper extremity cellulitis more markedly affecting the forearm. 3. Subchondral deformity of the capitellum which may be related to old remote trauma, osteochondritis dissecans or osteoarthritis. No acute fracture, joint  dislocation or bone destruction. Electronically Signed   By: Tollie Ethavid  Kwon M.D.   On: 10/11/2016 20:46     Labs:   Basic Metabolic Panel:  Recent Labs Lab 10/08/16 1505 10/11/16 1840 10/13/16 0858  NA 140 136 136  K 4.1 4.4 4.6  CL 101 101 99*  CO2 28 25 30   GLUCOSE 108* 105* 91  BUN 8 12 12   CREATININE 0.81 0.98 0.88  CALCIUM 9.9 9.3 9.2   GFR Estimated Creatinine Clearance: 123.4 mL/min (by C-G formula based on SCr of 0.88 mg/dL). Liver Function Tests:  Recent Labs Lab 10/08/16 1509  AST 28  ALT 73*  ALKPHOS 93  BILITOT 0.8  PROT 8.4*  ALBUMIN 4.1   CBC:  Recent Labs Lab 10/08/16 1505 10/11/16 1840 10/13/16 0858  WBC 19.9* 18.1* 7.1  NEUTROABS 16.3* 15.4*  --   HGB 15.6 14.4 14.4  HCT 45.8 42.5 43.7  MCV 89.3 90.2 90.9  PLT 280 345 329   Microbiology Recent Results (from the past 240 hour(s))  Blood culture (routine x 2)     Status: None   Collection Time: 10/08/16  4:31 PM  Result Value Ref Range Status   Specimen Description BLOOD LEFT HAND  Final   Special Requests Blood Culture adequate volume  Final   Culture NO GROWTH 5 DAYS  Final   Report Status 10/13/2016 FINAL  Final  Blood culture (routine x 2)     Status: None   Collection Time: 10/08/16  4:41 PM  Result Value Ref Range Status   Specimen Description BLOOD RIGHT HAND  Final   Special Requests Blood Culture adequate volume  Final   Culture NO GROWTH 5 DAYS  Final   Report Status 10/13/2016 FINAL  Final  Culture, blood (Routine X 2) w Reflex to ID Panel     Status: None (Preliminary result)   Collection Time: 10/11/16  6:40 PM  Result Value Ref Range Status   Specimen Description BLOOD LEFT HAND  Final   Special Requests   Final    BOTTLES DRAWN AEROBIC AND ANAEROBIC Blood Culture adequate volume   Culture NO GROWTH 2 DAYS  Final   Report Status PENDING  Incomplete  Surgical pcr screen     Status: Abnormal   Collection Time: 10/12/16 12:13 PM  Result Value Ref Range Status   MRSA,  PCR POSITIVE (A) NEGATIVE Final    Comment: RESULT CALLED TO, READ BACK BY AND VERIFIED WITH: DILDY,V ON 10/12/16 AT 1520 BY LOY,C    Staphylococcus aureus POSITIVE (A) NEGATIVE Final    Comment:        The Xpert SA Assay (FDA approved for NASAL specimens in patients over 25 years of age), is one component of a comprehensive surveillance program.  Test performance has been validated by [facility] for patients greater than or equal to 25 year old. It is not intended to diagnose infection nor to guide or monitor treatment. RESULT CALLED TO, READ BACK BY AND VERIFIED WITH: DILDY,V ON 10/12/16 AT 1520 BY LOY,C   Aerobic/Anaerobic Culture (surgical/deep wound)     Status: None (Preliminary result)   Collection Time: 10/12/16  1:11 PM  Result Value Ref Range Status   Specimen Description  WOUND RIGHT FOREARM  Final   Special Requests NONE  Final   Gram Stain   Final    ABUNDANT WBC PRESENT,BOTH PMN AND MONONUCLEAR FEW GRAM POSITIVE COCCI IN CLUSTERS Performed at [facility], [address] [place], [place] [address]    Culture   Final    MODERATE STAPHYLOCOCCUS AUREUS CULTURE REINCUBATED FOR BETTER GROWTH NO ANAEROBES ISOLATED; CULTURE IN PROGRESS FOR 5 DAYS    Report Status PENDING  Incomplete     Discharge Instructions:   Discharge Instructions    Ambulatory referral to Physical Therapy    Complete by:  As directed    Wound care dressing changes Monday, Wednesday, Friday Wet to dry normal saline to right forearm   Call MD for:  redness, tenderness, or signs of infection (pain, swelling, redness, odor or green/yellow discharge around incision site)    Complete by:  As directed    Call MD for:  temperature >100.4    Complete by:  As directed    Diet general    Complete by:  As directed    Increase activity slowly    Complete by:  As directed      Allergies as of 10/13/2016   No Known Allergies     Medication List    TAKE these medications   ibuprofen 800  MG tablet Commonly known as:  ADVIL,MOTRIN Take 1 tablet (800 mg total) by mouth every 8 (eight) hours as needed for headache or moderate pain. What changed:  medication strength   sulfamethoxazole-trimethoprim 800-160 MG tablet Commonly known as:  BACTRIM DS,SEPTRA DS Take 1 tablet by mouth 2 (two) times daily.   traMADol 50 MG tablet Commonly known as:  ULTRAM Take 1 tablet (50 mg total) by mouth every 6 (six) hours as needed.      Follow-up Information    Vickki Hearing, MD. Schedule an appointment as soon as possible for a visit in 1 week(s).   Specialties:  Orthopedic Surgery, Radiology Why:  Follow up of abscess  Contact information: [address] [place] [address] [phone]            Time coordinating discharge: < 25 minutes.  Signed:  Luria Rosario  Pager [phone] [facility] 10/13/2016, 4:26 PM

## 2016-10-13 NOTE — Progress Notes (Addendum)
Progress Note    Paul GALEN  Mckee:[id number] DOB: [birth year]  DOA: 10/11/2016 PCP: Patient, No Pcp Per    Brief Narrative:   Chief complaint: Follow-up cellulitis  Medical records reviewed and are as summarized below:  Paul Mckee is an 26 y.o. male with a PMH of IV drug abuse, tobacco abuse who initially presented 10/08/16 for evaluation of right forearm cellulitis. He declined admission at that time, and was sent out on Bactrim. He returned to the hospital 10/11/16 with nonresolution of cellulitis. CT of the right arm showed a 7. 7X3.3X 3.7 cm intramuscular abscess cellulitis. Hand surgery subsequently consulted.  Assessment/Plan:   Principal Problem:   Abscess of forearm, right: New, no additional work up Placed on empiric vancomycin and Rocephin. S/P I&D, follow up operative cultures (Preliminary report growing gram-positive cocci in clusters, likely MRSA).  Blood cultures from initial presentation 5 days ago are negative. Monitor renal function and watch for nephrotoxicity while on Vancomycin.OK to discharge home on Septra or doxycycline if OK with orthopedic surgery. If cleared, will need ortho to address surgical pain issues.  Active Problems:   Tobacco abuse: Ongoing Tobacco cessation counseling provided.    IVDU (intravenous drug user): Ongoing He denies recent use however abscess formation indicative of active ongoing use. UDS + opiates. Avoid IV narcotics  Oxycodone ordered by surgery for pain control. SW consulted for substance abuse counseling.    HIV screening The patient falls between the ages of 13-64 and has high risk behaviors and should be screened for HIV, therefore HIV testing done: Non-reactive.    MRSA carrier: Ongoing Contact precautions. Decontamination therapy ordered.    Elevated blood pressure: Stable BP controlled on Clonidine.  Family Communication/Anticipated D/C date and plan/Code Status   DVT prophylaxis: Heparin ordered. Code  Status: Full Code.  Family Communication: Wife at bedside 10/12/16. Disposition Plan: Home when operative culture/sensitivities available, or when cleared by ortho.   Medical Consultants:    Orthopedic Surgery.   Anti-Infectives:    Vancomycin 10/11/16--->  Rocephin 10/11/16--->  Subjective:   "When can I go home?" Wants to be sure he is sent home on pain medication, "they cut me open, so they should give me pain medicine...". ROS negative for nausea, diarrhea. Reports left arm pain.  Objective:    Vitals:   10/12/16 1455 10/12/16 2053 10/12/16 2231 10/13/16 0620  BP: 128/79  103/65 110/68  Pulse: 80  83 (!) 51  Resp: 18  18 18   Temp: 98.1 F (36.7 C)  98.9 F (37.2 C) 97.7 F (36.5 C)  TempSrc:   Oral Oral  SpO2: 98% 98% 99% 99%  Weight:      Height:        Intake/Output Summary (Last 24 hours) at 10/13/16 1037 Last data filed at 10/12/16 2000  Gross per 24 hour  Intake          2860.42 ml  Output              250 ml  Net          2610.42 ml   Filed Weights   10/11/16 1638 10/12/16 1150  Weight: 68 kg (150 lb) 68 kg (150 lb)     Exam:  Reviewed 10/12/16 and unchanged except as noted. General exam: Thin young male who is awake and alert. Respiratory system: Lungs diminished but clear.  No rales, rhonchi or wheezes. Cardiovascular system: Heart sounds are regular, no murmurs, rubs or gallops. Gastrointestinal system:  Abdomen is soft, non-tender, non-distended with normal active bowel sounds. Central nervous system: Awake, but oriented x 3.  MAEx4. Grossly normal. Extremities: Right arm wrapped in ace wrap, moves fingers well.  No cyanosis or edema. Skin: Warm and dry.  No rashes. Right arm wound not visualized. Psychiatry: Mood and affect depressed/flat.  Poor insight/judgement.  Data Reviewed:   I have personally reviewed following labs and imaging studies:  Labs: Basic Metabolic Panel:  Recent Labs Lab 10/08/16 1505 10/11/16 1840 10/13/16 0858    NA 140 136 136  K 4.1 4.4 4.6  CL 101 101 99*  CO2 28 25 30   GLUCOSE 108* 105* 91  BUN 8 12 12   CREATININE 0.81 0.98 0.88  CALCIUM 9.9 9.3 9.2   GFR Estimated Creatinine Clearance: 123.4 mL/min (by C-G formula based on SCr of 0.88 mg/dL). Liver Function Tests:  Recent Labs Lab 10/08/16 1509  AST 28  ALT 73*  ALKPHOS 93  BILITOT 0.8  PROT 8.4*  ALBUMIN 4.1   CBC:  Recent Labs Lab 10/08/16 1505 10/11/16 1840 10/13/16 0858  WBC 19.9* 18.1* 7.1  NEUTROABS 16.3* 15.4*  --   HGB 15.6 14.4 14.4  HCT 45.8 42.5 43.7  MCV 89.3 90.2 90.9  PLT 280 345 329   Sepsis Labs:  Recent Labs Lab 10/08/16 1505 10/11/16 1840 10/13/16 0858  WBC 19.9* 18.1* 7.1  LATICACIDVEN  --  1.5  --     Microbiology Recent Results (from the past 240 hour(s))  Blood culture (routine x 2)     Status: None   Collection Time: 10/08/16  4:31 PM  Result Value Ref Range Status   Specimen Description BLOOD LEFT HAND  Final   Special Requests Blood Culture adequate volume  Final   Culture NO GROWTH 5 DAYS  Final   Report Status 10/13/2016 FINAL  Final  Blood culture (routine x 2)     Status: None   Collection Time: 10/08/16  4:41 PM  Result Value Ref Range Status   Specimen Description BLOOD RIGHT HAND  Final   Special Requests Blood Culture adequate volume  Final   Culture NO GROWTH 5 DAYS  Final   Report Status 10/13/2016 FINAL  Final  Culture, blood (Routine X 2) w Reflex to ID Panel     Status: None (Preliminary result)   Collection Time: 10/11/16  6:40 PM  Result Value Ref Range Status   Specimen Description BLOOD LEFT HAND  Final   Special Requests   Final    BOTTLES DRAWN AEROBIC AND ANAEROBIC Blood Culture adequate volume   Culture NO GROWTH 2 DAYS  Final   Report Status PENDING  Incomplete  Surgical pcr screen     Status: Abnormal   Collection Time: 10/12/16 12:13 PM  Result Value Ref Range Status   MRSA, PCR POSITIVE (A) NEGATIVE Final    Comment: RESULT CALLED TO, READ BACK  BY AND VERIFIED WITH: DILDY,V ON 10/12/16 AT 1520 BY LOY,C    Staphylococcus aureus POSITIVE (A) NEGATIVE Final    Comment:        The Xpert SA Assay (FDA approved for NASAL specimens in patients over 25 years of age), is one component of a comprehensive surveillance program.  Test performance has been validated by [facility] for patients greater than or equal to 25 year old. It is not intended to diagnose infection nor to guide or monitor treatment. RESULT CALLED TO, READ BACK BY AND VERIFIED WITH: DILDY,V ON 10/12/16 AT 1520 BY  LOY,C   Aerobic/Anaerobic Culture (surgical/deep wound)     Status: None (Preliminary result)   Collection Time: 10/12/16  1:11 PM  Result Value Ref Range Status   Specimen Description WOUND RIGHT FOREARM  Final   Special Requests NONE  Final   Gram Stain   Final    ABUNDANT WBC PRESENT,BOTH PMN AND MONONUCLEAR FEW GRAM POSITIVE COCCI IN CLUSTERS Performed at [facility], [address] [place], [place] [address]    Culture PENDING  Incomplete   Report Status PENDING  Incomplete    Procedures and diagnostic studies:  Mr Forearm Right W Wo Contrast 10/12/2016: Personally reviewed. Abscess is as pictured.  No evidence of osteomyelitis.  l.     Ct Extrem Up Entire Arm R W/cm 10/11/2016: Personally reviewed. Large abscess as pictured. Subchondral deformity as pictured.   Medications:   . chlorhexidine   Topical Daily  . cloNIDine  0.1 mg Oral BID  . heparin  5,000 Units Subcutaneous Q8H  . mupirocin ointment   Nasal BID  . nicotine  14 mg Transdermal Daily   Continuous Infusions:   Medical decision making is of high complexity and this patient is at high risk of deterioration, therefore this is a level 3 visit.  (> 4 problem points, 1 data points, high risk: Need 2 out of 3)   Problems/DDx Points   Self limited or minor (max 2)       1  1  Established problem, stable       1   Established problem, worsening       2   New problem,  no additional W/U planned (max 1)       3  3  New problem, additional W/U planned        4    Data Reviewed Points   Review/order clinical lab tests       1  1  Review/order x-rays       1   Review/order tests (Echo, EKG, PFTs, etc)       1   Discussion of test results w/ performing MD       1   Independent review of image, tracing or specimen       2    Decision to obtain old records       1   Review and summation of old records       2    Level of risk Presenting prob Diagnostics Management   Minimal 1 self limited/minor Labs CXR EKG/EEG U/A U/S Rest Gargles Bandages Dressings   Low 2 or more self limited/minor 1 stable chronic Acute uncomplicated illness Tests (PFTS) Non-CV imaging Arterial labs Biopsies of skin OTC drugs Minor surgery-no risk PT OT IVF without additives    Moderate 1 or more chronic illnesses w/ mild exac, progression or S/E from tx 2 or more stable chronic illnesses Undiagnosed new problem w/ uncertain prognosis Acute complicated injury  Stress tests Endoscopies with no risk factors Deep needle or incisional bx CV imaging without risk LP Thoracentesis Paracentesis Minor surgery w/ risks Elective major surgery w/ no risk (open, percutaneous or endoscopic) Prescription drugs Therapeutic nucl med IVF with additives Closed tx of fracture/dislocation    High Severe exac of chronic illness Acute or chronic illness/injury may pose a threat to life or bodily function (ARF) Change in neuro status    CV imaging w/ contrast and risk Cardio electophysiologic tests Endoscopies w/ risk Discography Elective major surgery Emergency  major surgery Parenteral controlled substances Drug therapy req monitoring for toxicity DNR/de-escalation of care    MDM Prob points Data points Risk   Straightforward    <1    <1    Min   Low complexity    2    2    Low   Moderate    3    3    Mod   High Complexity    4 or more    4 or more    High      LOS: 2  days   Jordayn Mink  [facility] Pager [phone]. If unable to reach me by pager, please call my cell phone at [phone].  *Please refer to [email or web address], password [email or web address] to get updated schedule on who will round on this patient, as hospitalists switch teams weekly. If 7PM-7AM, please contact night-coverage at [email or web address], password [email or web address] for any overnight needs.  10/13/2016, 10:37 AM

## 2016-10-13 NOTE — Discharge Instructions (Signed)
Incision and Drainage Incision and drainage is a surgical procedure to open and drain a fluid-filled sac. The sac may be filled with pus, mucus, or blood. Examples of fluid-filled sacs that may need surgical drainage include cysts, skin infections (abscesses), and red lumps that develop from a ruptured cyst or a small abscess (boils). You may need this procedure if the affected area is large, painful, infected, or not healing well. Tell a health care provider about:  Any allergies you have.  All medicines you are taking, including vitamins, herbs, eye drops, creams, and over-the-counter medicines.  Any problems you or family members have had with anesthetic medicines.  Any blood disorders you have.  Any surgeries you have had.  Any medical conditions you have.  Whether you are pregnant or may be pregnant. What are the risks? Generally, this is a safe procedure. However, problems may occur, including:  Infection.  Bleeding.  Allergic reactions to medicines.  Scarring.  What happens before the procedure?  You may need an ultrasound or other imaging tests to see how large or deep the fluid-filled sac is.  You may have blood tests to check for infection.  You may get a tetanus shot.  You may be given antibiotic medicine to help prevent infection.  Follow instructions from your health care provider about eating or drinking restrictions.  Ask your health care provider about: ? Changing or stopping your regular medicines. This is especially important if you are taking diabetes medicines or blood thinners. ? Taking medicines such as aspirin and ibuprofen. These medicines can thin your blood. Do not take these medicines before your procedure if your health care provider instructs you not to.  Plan to have someone take you home after the procedure.  If you will be going home right after the procedure, plan to have someone stay with you for 24 hours. What happens during the  procedure?  To reduce your risk of infection: ? Your health care team will wash or sanitize their hands. ? Your skin will be washed with soap.  You will be given one or more of the following: ? A medicine to help you relax (sedative). ? A medicine to numb the area (local anesthetic). ? A medicine to make you fall asleep (general anesthetic).  An incision will be made in the top of the fluid-filled sac.  The contents of the sac may be squeezed out, or a syringe or tube (catheter)may be used to empty the sac.  The catheter may be left in place for several weeks to drain any fluid. Or, your health care provider may stitch open the edges of the incision to make a long-term opening for drainage (marsupialization).  The inside of the sac may be washed out (irrigated) with a sterile solution and packed with gauze before it is covered with a bandage (dressing). The procedure may vary among health care providers and hospitals. What happens after the procedure?  Your blood pressure, heart rate, breathing rate, and blood oxygen level will be monitored often until the medicines you were given have worn off.  Do not drive for 24 hours if you received a sedative. This information is not intended to replace advice given to you by your health care provider. Make sure you discuss any questions you have with your health care provider. Document Released: 09/06/2000 Document Revised: 08/19/2015 Document Reviewed: 01/01/2015 Elsevier Interactive Patient Education  2017 [employer]  

## 2016-10-13 NOTE — Anesthesia Postprocedure Evaluation (Signed)
Anesthesia Post Note  Patient: Deno LungerJacob E Tinnell  Procedure(s) Performed: Procedure(s) (LRB): INCISION AND DRAINAGE ABSCESS, right  forearm (Right)  Patient location during evaluation: Nursing Unit Anesthesia Type: General Level of consciousness: awake and alert and oriented Pain management: pain level controlled Vital Signs Assessment: post-procedure vital signs reviewed and stable Respiratory status: spontaneous breathing Cardiovascular status: stable Anesthetic complications: no     Last Vitals:  Vitals:   10/12/16 2231 10/13/16 0620  BP: 103/65 110/68  Pulse: 83 (!) 51  Resp: 18 18  Temp: 37.2 C 36.5 C    Last Pain:  Vitals:   10/13/16 0641  TempSrc:   PainSc: Asleep                 Cobi Delph A

## 2016-10-13 NOTE — Progress Notes (Signed)
  Patient ID: Paul Mckee, male   DOB: 06/05/1990, 26 y.o.   MRN: [id number]  The patient has staph aureus growing in his culture without identification  So he can go home on Bactrim  DS, 1 by mouth twice a day for 30 days  I also asked social services to get PT for him as an outpatient Monday Wednesday Friday dressing changes wet to dry normal saline  follow-up to see me next Friday

## 2016-10-13 NOTE — Progress Notes (Addendum)
Patient IV removed, tolerated well. Dr. Romeo AppleHarrison contacted in regards to patient discharge. Patient cleared for discharge per Dr. Romeo AppleHarrison. Dr. Darnelle Catalanama notified, discharge orders put in, Dr. Darnelle Catalanama ordered Ibuprofen 800 mg to address patient pain, If patient needed anything stronger such as narcotics, the surgeon should address due to patient history of substance abuse . Dr. Romeo AppleHarrison contacted in regards to pain medication. Dr. Romeo AppleHarrison states he is out of town and unable to see the patient to prescribed pain medications. Proceeding with discharge at this time.

## 2016-10-13 NOTE — Plan of Care (Signed)
Problem: Safety: Goal: Ability to remain free from injury will improve Outcome: Adequate for Discharge Pt on moderate fall risk d/t pain medication he is receiving. Pt up ad lib in room and in hallway with steady gait. Pt educated on fall risk and safety prevention. Pt verbalized understanding. Pts bed in lowest locked position, SR up x3, call bell within reach. Will continue to monitor pt

## 2016-10-16 ENCOUNTER — Encounter (HOSPITAL_COMMUNITY): Payer: Self-pay | Admitting: Orthopedic Surgery

## 2016-10-16 LAB — CULTURE, BLOOD (ROUTINE X 2)
Culture: NO GROWTH
SPECIAL REQUESTS: ADEQUATE

## 2016-10-18 ENCOUNTER — Ambulatory Visit (HOSPITAL_COMMUNITY): Payer: Medicaid Other | Admitting: Physical Therapy

## 2016-10-18 LAB — AEROBIC/ANAEROBIC CULTURE W GRAM STAIN (SURGICAL/DEEP WOUND)

## 2016-10-18 LAB — AEROBIC/ANAEROBIC CULTURE (SURGICAL/DEEP WOUND)

## 2016-10-25 ENCOUNTER — Emergency Department (HOSPITAL_COMMUNITY)
Admission: EM | Admit: 2016-10-25 | Discharge: 2016-10-25 | Disposition: A | Discharge status: Home / Self Care | Payer: Medicaid Other | Attending: Emergency Medicine | Admitting: Emergency Medicine

## 2016-10-25 ENCOUNTER — Encounter (HOSPITAL_COMMUNITY): Payer: Self-pay | Admitting: Emergency Medicine

## 2016-10-25 DIAGNOSIS — Z4800 Encounter for change or removal of nonsurgical wound dressing: Secondary | ICD-10-CM | POA: Diagnosis present

## 2016-10-25 DIAGNOSIS — F17210 Nicotine dependence, cigarettes, uncomplicated: Secondary | ICD-10-CM | POA: Insufficient documentation

## 2016-10-25 DIAGNOSIS — Z5189 Encounter for other specified aftercare: Secondary | ICD-10-CM

## 2016-10-25 MED ORDER — IBUPROFEN 800 MG PO TABS: 800.0000 mg | ORAL_TABLET | Freq: Three times a day (TID) | ORAL | 0 refills | Status: DC

## 2016-10-25 MED ORDER — DICLOFENAC SODIUM 75 MG PO TBEC: 75.0000 mg | DELAYED_RELEASE_TABLET | Freq: Two times a day (BID) | ORAL | 0 refills | Status: AC

## 2016-10-25 NOTE — ED Triage Notes (Signed)
Pt reports had "abscess drained in OR" on 7/19. Pt reports given abx post discharge but has run out of abx and reports continued pain and "odor" to site. Pt denies any known fever. Pt reports has been unable to follow-up due to "mediciad not covering it".

## 2016-10-25 NOTE — ED Provider Notes (Signed)
[facility] Provider Note   CSN: [id number] Arrival date & time: 10/25/16  1412     History   Chief Complaint Chief Complaint  Patient presents with  . Wound Check    HPI Paul Mckee is a 26 y.o. male who presents for a wound check. He was discharged from here 10 days ago at which time he had a right forearm abscess surgically drained in the OR by Dr Romeo AppleHarrison.  This wound was felt to be the result of IVDU.  He reports persistent pain at the wound site and yesterday pulled off a lot of green appearing scabbing from around the edges of the wound and has persistent pain at the site. He denies fevers, chills or radiation of pain. He has keeping the wound covered and wife has been applying an antibiotic ointment to the wound edges.    He was sent home with instructions for following up with Dr. Romeo AppleHarrison in one week and to arrange rehab tiw wet to dry dressing changes with [facility]. He reports he did not know he needed to f/u with Dr. Romeo AppleHarrison and when he tried to get an appointment with rehab, he was turned down since medicaid would not cover, but [facility].  Pt states was also turned down there since they did not have his medical records.   The history is provided by the patient and the spouse.    History reviewed. No pertinent past medical history.  Patient Active Problem List   Diagnosis Date Noted  . Arm swelling   . Cellulitis of right upper extremity   . IVDU (intravenous drug user) 10/12/2016  . MRSA carrier 10/12/2016  . Elevated blood pressure reading 10/12/2016  . Abscess of forearm, right 10/11/2016  . Tobacco abuse 10/11/2016    Past Surgical History:  Procedure Laterality Date  . INCISION AND DRAINAGE ABSCESS Right 10/12/2016   Procedure: INCISION AND DRAINAGE ABSCESS, right  forearm;  Surgeon: Vickki HearingHarrison, Stanley E, MD;  Location: [facility];  Service: Orthopedics;  Laterality: Right;       Home Medications    Prior to Admission  medications   Medication Sig Start Date End Date Taking? Authorizing Provider  oxycodone (OXY-IR) 5 MG capsule Take 5 mg by mouth every 4 (four) hours as needed.   Yes [provider]  sulfamethoxazole-trimethoprim (BACTRIM DS,SEPTRA DS) 800-160 MG tablet Take 1 tablet by mouth 2 (two) times daily. 10/13/16 11/12/16 Yes Rama, Maryruth Bunhristina P, MD  diclofenac (VOLTAREN) 75 MG EC tablet Take 1 tablet (75 mg total) by mouth 2 (two) times daily. 10/25/16   Burgess AmorIdol, Bob Eastwood, PA-C  traMADol (ULTRAM) 50 MG tablet Take 1 tablet (50 mg total) by mouth every 6 (six) hours as needed. Patient not taking: Reported on 10/11/2016 10/08/16   Bethann BerkshireZammit, Joseph, MD    Family History History reviewed. No pertinent family history.  Social History Social History  Substance Use Topics  . Smoking status: Current Every Day Smoker    Packs/day: 1.00    Types: Cigarettes  . Smokeless tobacco: Never Used  . Alcohol use No     Allergies   Patient has no known allergies.   Review of Systems Review of Systems  Constitutional: Negative for chills and fever.  Respiratory: Negative for shortness of breath and wheezing.   Skin: Positive for wound.  Neurological: Negative for numbness.     Physical Exam Updated Vital Signs BP 113/89 (BP Location: Right Arm)   Pulse (!) 119  Resp 18   Ht 5\' 8"  (1.727 m)   Wt 68 kg (150 lb)   SpO2 98%   BMI 22.81 kg/m   Physical Exam  Constitutional: He is oriented to person, place, and time. He appears well-developed and well-nourished.  HENT:  Head: Normocephalic.  Cardiovascular: Normal rate.   Pulmonary/Chest: Effort normal.  Musculoskeletal: He exhibits tenderness.  Neurological: He is alert and oriented to person, place, and time. No sensory deficit.  Skin:  Clean appearing abscess I and D site right volar forearm, approximately 8 cm x 3 cm open wound with pink granulation tissue.  No surrounding erythema or induration.  No drainage. Wound appears clean.     ED  Treatments / Results  Labs (all labs ordered are listed, but only abnormal results are displayed) Labs Reviewed - No data to display  EKG  EKG Interpretation None       Radiology No results found.  Procedures Procedures (including critical care time)  Medications Ordered in ED Medications - No data to display   Initial Impression / Assessment and Plan / ED Course  I have reviewed the triage vital signs and the nursing notes.  Pertinent labs & imaging results that were available during my care of the patient were reviewed by me and considered in my medical decision making (see chart for details).     Call placed to [facility] - they cannot in fact see pt due to no MD on site and his medicaid status, therefore was referred to rehab in [place].  Pt's wound does appear to be healing well, although it will take a long time to heal completely given size of site. He was given a wet to dry dressing and instructed in completing this every other day.  He was also advised to f/u with Dr Romeo AppleHarrison this week for a recheck as was originally planned.    Pt was seen by Dr Lynelle DoctorKnapp prior to dc.  Final Clinical Impressions(s) / ED Diagnoses   Final diagnoses:  Visit for wound check    New Prescriptions Discharge Medication List as of 10/25/2016  3:43 PM    START taking these medications   Details  diclofenac (VOLTAREN) 75 MG EC tablet Take 1 tablet (75 mg total) by mouth 2 (two) times daily., Starting Wed 10/25/2016, Print         Burgess Amordol, Marybeth Dandy, PA-C 10/25/16 1653    Linwood DibblesKnapp, Jon, MD 10/27/16 [phone]

## 2016-10-25 NOTE — ED Notes (Signed)
Pt states ibuprofen and tramadol hurts his stomach and does not help his pain.  Ask pt to suggest medication if he knew of any that would help his pain.  Pt states "somthing like roxys or oxycodone."   EDP notified.

## 2016-10-25 NOTE — Discharge Instructions (Signed)
Your wound looks like it is healing well without infection and with healthy granulation tissue starting to fill in.  However, with the size of this abscess it will not heal quickly.  You should apply a wet to dry dressing every other day as you were instructed to do here today.  Also, you need to call Dr Romeo AppleHarrison for an office visit for a recheck of this site.

## 2018-10-07 ENCOUNTER — Emergency Department (HOSPITAL_COMMUNITY)
Admission: EM | Admit: 2018-10-07 | Discharge: 2018-10-07 | Disposition: A | Discharge status: Home / Self Care | Payer: Self-pay | Attending: Emergency Medicine | Admitting: Emergency Medicine

## 2018-10-07 ENCOUNTER — Other Ambulatory Visit: Payer: Self-pay

## 2018-10-07 DIAGNOSIS — K047 Periapical abscess without sinus: Secondary | ICD-10-CM | POA: Insufficient documentation

## 2018-10-07 DIAGNOSIS — F17210 Nicotine dependence, cigarettes, uncomplicated: Secondary | ICD-10-CM | POA: Insufficient documentation

## 2018-10-07 DIAGNOSIS — R220 Localized swelling, mass and lump, head: Secondary | ICD-10-CM | POA: Insufficient documentation

## 2018-10-07 MED ORDER — OXYCODONE-ACETAMINOPHEN 5-325 MG PO TABS: 1.0000 | ORAL_TABLET | Freq: Four times a day (QID) | ORAL | 0 refills | Status: AC | PRN

## 2018-10-07 MED ORDER — OXYCODONE-ACETAMINOPHEN 5-325 MG PO TABS
2.0000 | ORAL_TABLET | Freq: Once | ORAL | Status: AC
  Administered 2018-10-07: 14:00:00 2 via ORAL
  Filled 2018-10-07: qty 2

## 2018-10-07 MED ORDER — AMOXICILLIN-POT CLAVULANATE 875-125 MG PO TABS: 1.0000 | ORAL_TABLET | Freq: Two times a day (BID) | ORAL | 0 refills | Status: DC

## 2018-10-07 NOTE — Discharge Instructions (Addendum)
Please read attached information. If you experience any new or worsening signs or symptoms please return to the emergency room for evaluation. Please follow-up with your primary care provider or specialist as discussed. Please use medication prescribed only as directed and discontinue taking if you have any concerning signs or symptoms.   °

## 2018-10-07 NOTE — ED Provider Notes (Signed)
[name]ENC[facility] Provider Note   CSN: [id number] Arrival date & time: 10/07/18  1224    History   Chief Complaint Chief Complaint  Patient presents with  . Dental Pain    HPI Paul Mckee is a 28 y.o. male.     HPI   28 year old male presents today with complaints of left upper dental pain.  Patient notes 3 days ago he developed pain to his left upper tooth.  He notes severe dental decay in this area.  He notes swelling up into the face in addition this morning.  He denies fever at home.  No trauma to the face.  He notes using ibuprofen without symptomatic improvement.   No past medical history on file.  Patient Active Problem List   Diagnosis Date Noted  . Arm swelling   . Cellulitis of right upper extremity   . IVDU (intravenous drug user) 10/12/2016  . MRSA carrier 10/12/2016  . Elevated blood pressure reading 10/12/2016  . Abscess of forearm, right 10/11/2016  . Tobacco abuse 10/11/2016    Past Surgical History:  Procedure Laterality Date  . INCISION AND DRAINAGE ABSCESS Right 10/12/2016   Procedure: INCISION AND DRAINAGE ABSCESS, right  forearm;  Surgeon: Vickki HearingHarrison, Stanley E, MD;  Location: [facility];  Service: Orthopedics;  Laterality: Right;        Home Medications    Prior to Admission medications   Medication Sig Start Date End Date Taking? Authorizing Provider  amoxicillin-clavulanate (AUGMENTIN) 875-125 MG tablet Take 1 tablet by mouth every 12 (twelve) hours. 10/07/18   Socrates Cahoon, Tinnie GensJeffrey, PA-C  amoxicillin-clavulanate (AUGMENTIN) 875-125 MG tablet Take 1 tablet by mouth every 12 (twelve) hours. 10/07/18   Petrona Wyeth, Tinnie GensJeffrey, PA-C  diclofenac (VOLTAREN) 75 MG EC tablet Take 1 tablet (75 mg total) by mouth 2 (two) times daily. 10/25/16   Burgess AmorIdol, Julie, PA-C  oxycodone (OXY-IR) 5 MG capsule Take 5 mg by mouth every 4 (four) hours as needed.    [provider]  oxyCODONE-acetaminophen (PERCOCET/ROXICET) 5-325 MG tablet Take  1 tablet by mouth every 6 (six) hours as needed. 10/07/18   Regene Mccarthy, Tinnie GensJeffrey, PA-C  traMADol (ULTRAM) 50 MG tablet Take 1 tablet (50 mg total) by mouth every 6 (six) hours as needed. Patient not taking: Reported on 10/11/2016 10/08/16   Bethann BerkshireZammit, Joseph, MD    Family History No family history on file.  Social History Social History   Tobacco Use  . Smoking status: Current Every Day Smoker    Packs/day: 1.00    Types: Cigarettes  . Smokeless tobacco: Never Used  Substance Use Topics  . Alcohol use: No  . Drug use: No     Allergies   Patient has no known allergies.   Review of Systems Review of Systems  All other systems reviewed and are negative.    Physical Exam Updated Vital Signs BP 128/88 (BP Location: Right Arm)   Pulse 87   Temp 99.7 F (37.6 C) (Oral)   Resp 14   Ht 5\' 8"  (1.727 m)   Wt 72.6 kg   SpO2 100%   BMI 24.33 kg/m   Physical Exam Vitals signs and nursing note reviewed.  Constitutional:      Appearance: He is well-developed.  HENT:     Head: Normocephalic and atraumatic.     Comments: Left-sided facial swelling, no erythema, left upper gumline palpated with induration, no fluctuance-severe dental decay throughout the entire mouth  Eyes:     General: No  scleral icterus.       Right eye: No discharge.        Left eye: No discharge.     Conjunctiva/sclera: Conjunctivae normal.     Pupils: Pupils are equal, round, and reactive to light.  Neck:     Musculoskeletal: Normal range of motion.     Vascular: No JVD.     Trachea: No tracheal deviation.  Pulmonary:     Effort: Pulmonary effort is normal.     Breath sounds: No stridor.  Neurological:     Mental Status: He is alert and oriented to person, place, and time.     Coordination: Coordination normal.  Psychiatric:        Behavior: Behavior normal.        Thought Content: Thought content normal.        Judgment: Judgment normal.      ED Treatments / Results  Labs (all labs ordered are  listed, but only abnormal results are displayed) Labs Reviewed - No data to display  EKG None  Radiology No results found.  Procedures Procedures (including critical care time)  Medications Ordered in ED Medications  oxyCODONE-acetaminophen (PERCOCET/ROXICET) 5-325 MG per tablet 2 tablet (2 tablets Oral Given 10/07/18 1355)     Initial Impression / Assessment and Plan / ED Course  I have reviewed the triage vital signs and the nursing notes.  Pertinent labs & imaging results that were available during my care of the patient were reviewed by me and considered in my medical decision making (see chart for details).        Labs:   Imaging:  Consults:  Therapeutics:  Discharge Meds: Augmentin, Percocet  Assessment/Plan: 28 year old male presents today with dental infection.  He has no drainable abscess.  No signs of cellulitis.  Patient will be placed on antibiotics given pain medicine encouraged follow-up closely with oral surgeon.  He is given strict return precautions.  He verbalized understanding and agreement to today's plan had no further questions or concerns at the time of discharge.      Final Clinical Impressions(s) / ED Diagnoses   Final diagnoses:  Dental infection    ED Discharge Orders         Ordered    amoxicillin-clavulanate (AUGMENTIN) 875-125 MG tablet  Every 12 hours     10/07/18 1355    oxyCODONE-acetaminophen (PERCOCET/ROXICET) 5-325 MG tablet  Every 6 hours PRN     10/07/18 1359    amoxicillin-clavulanate (AUGMENTIN) 875-125 MG tablet  Every 12 hours     10/07/18 1359           Okey Regal, PA-C 10/07/18 1359    Tegeler, Gwenyth Allegra, MD 10/08/18 [phone]

## 2018-10-07 NOTE — ED Triage Notes (Signed)
Pt c/o top left side tooth pain along with left facial swelling ; pt denies any sob; pt also /o lower back pain ; denies any trauma but does state he was working on a transmission yesterday

## 2018-10-07 NOTE — ED Notes (Signed)
Patient verbalizes understanding of discharge instructions. Opportunity for questioning and answering were provided.  patient discharged from ED.  

## 2018-10-12 ENCOUNTER — Other Ambulatory Visit: Payer: Self-pay

## 2018-10-12 ENCOUNTER — Emergency Department (HOSPITAL_COMMUNITY)
Admission: EM | Admit: 2018-10-12 | Discharge: 2018-10-13 | Disposition: A | Discharge status: Home / Self Care | Payer: Self-pay | Attending: Emergency Medicine | Admitting: Emergency Medicine

## 2018-10-12 DIAGNOSIS — Z5321 Procedure and treatment not carried out due to patient leaving prior to being seen by health care provider: Secondary | ICD-10-CM | POA: Insufficient documentation

## 2018-10-12 DIAGNOSIS — K0889 Other specified disorders of teeth and supporting structures: Secondary | ICD-10-CM | POA: Insufficient documentation

## 2018-10-12 NOTE — ED Triage Notes (Signed)
Per pt he was here Monday with dental issues and swelling in his face. Was given antibiotics but not working. Hard to chew.

## 2018-10-13 NOTE — ED Notes (Signed)
Pt called for room x3  

## 2018-10-19 IMAGING — MR MR FOREARM*R* WO/W CM
6 of 9 series · 19 of 40 positions shown · IV contrast (multihance)
Comparison: CT right upper arm 10/11/2016.

CLINICAL DATA: Right forearm, redness, pain and swelling since
approximately 10/06/2016. The patient was seen in the emergency
department a given antibiotics 10/08/2016.

EXAM:
MRI OF THE RIGHT FOREARM WITHOUT AND WITH CONTRAST
TECHNIQUE: Multiplanar, multisequence MR imaging of the right forearm was
performed before and after the administration of intravenous
contrast.
CONTRAST:  14 ml MULTIHANCE GADOBENATE DIMEGLUMINE 529 MG/ML IV SOLN

[Series 19: t2fs coronal · coronal · 5.0mm · 0.78mm/px · 3 of 16 slices shown]
[im 1/16]
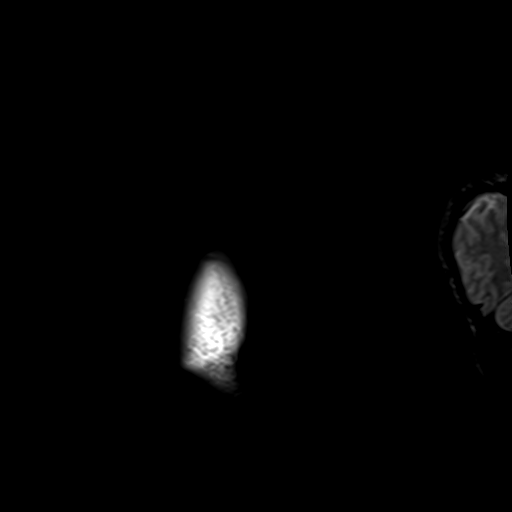
[im 8/16]
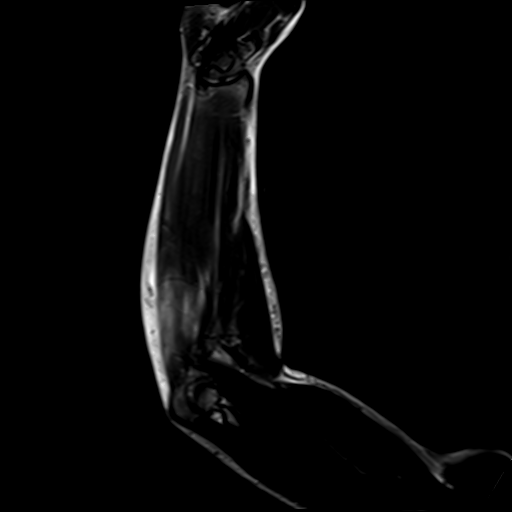
[im 16/16]
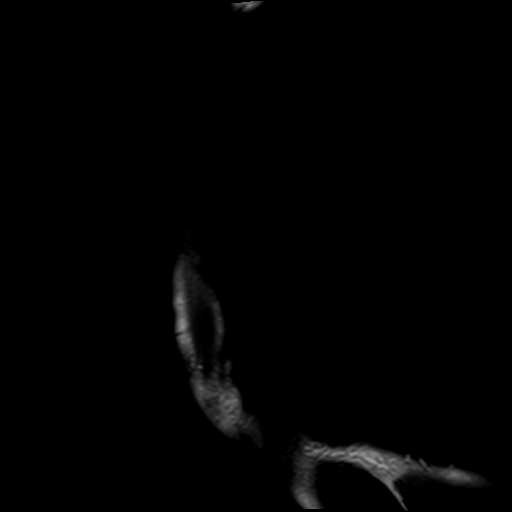

[Series 20: T1 · coronal · 5.0mm · 0.78mm/px · 2 of 16 slices shown (1 of 3)]
[im 1/16]
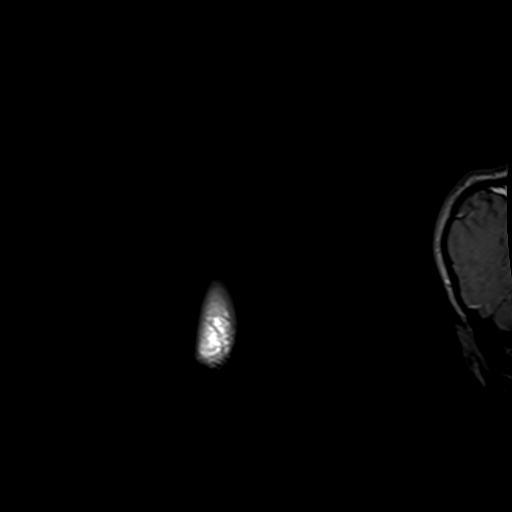
[im 16/16]
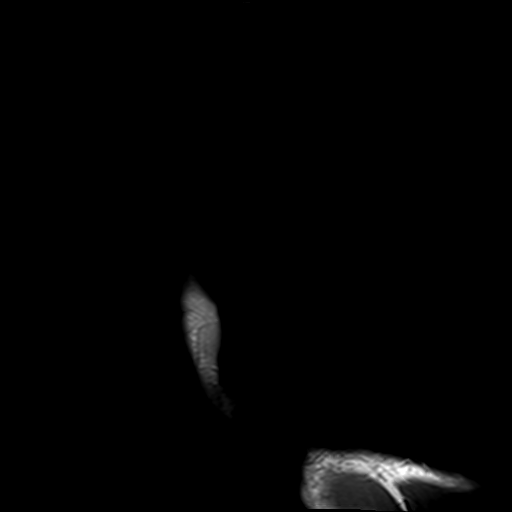

[Series 22: T1 · sagittal · 5.0mm · 0.32mm/px · 7 of 60 slices shown (2 of 3)]
[im 1/60]
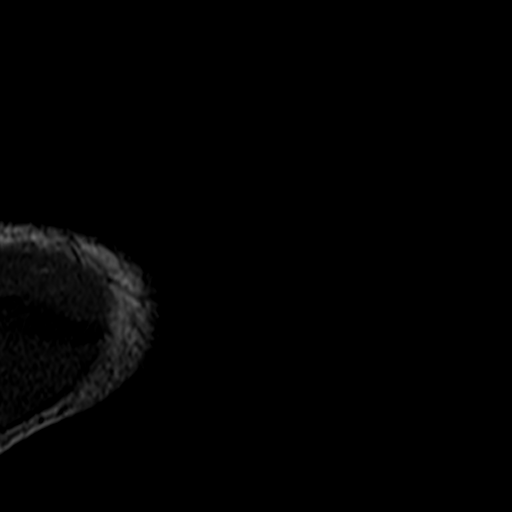
[im 10/60]
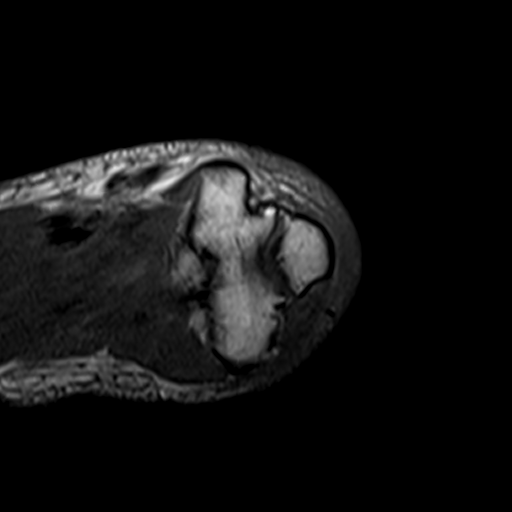
[im 20/60]
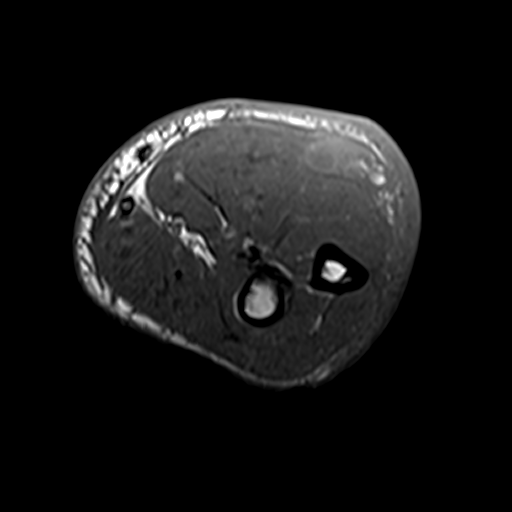
[im 30/60]
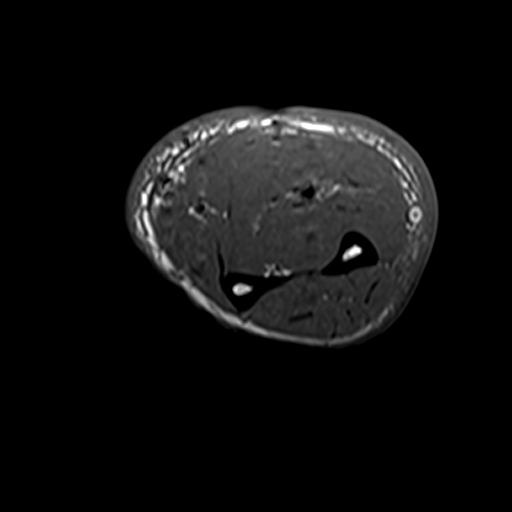
[im 40/60]
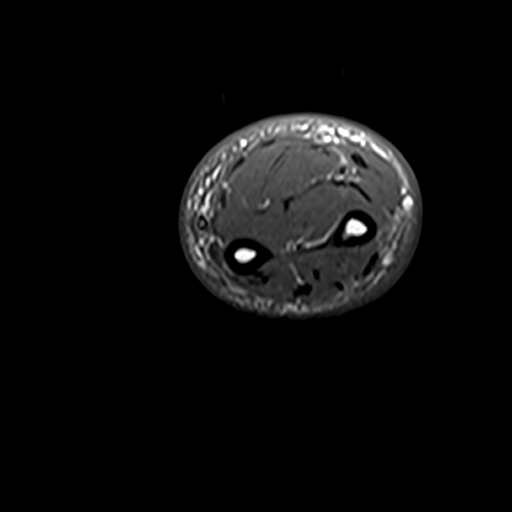
[im 50/60]
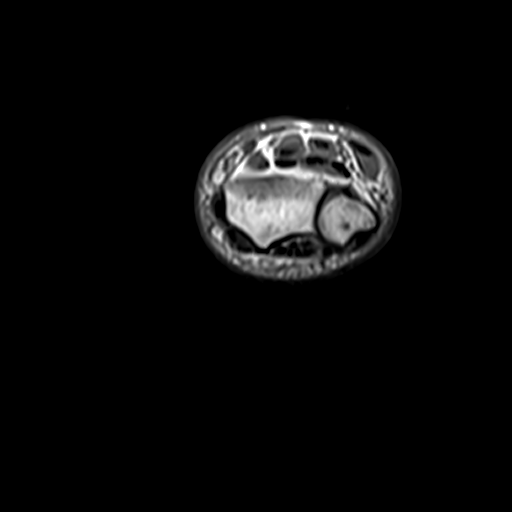
[im 60/60]
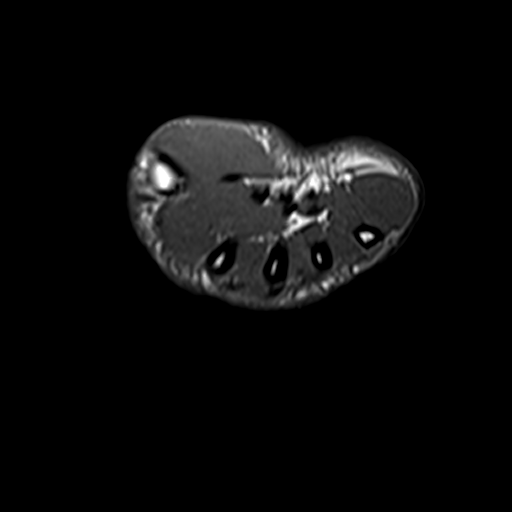

[Series 23: T1 · axial · 5.0mm · 0.78mm/px · z∈[+91,+198]mm · 3 of 21 slices shown (3 of 3)]
[im 1/21]
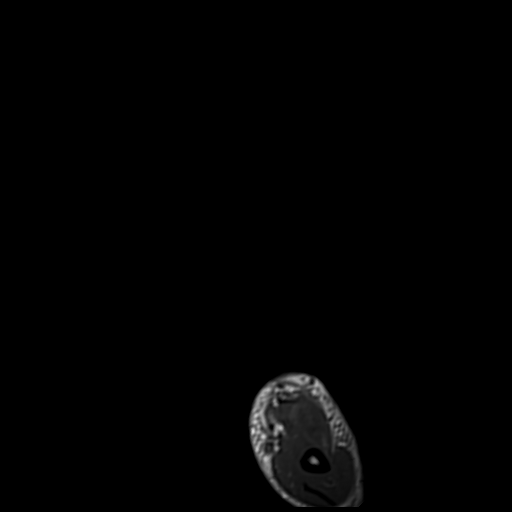
[im 11/21]
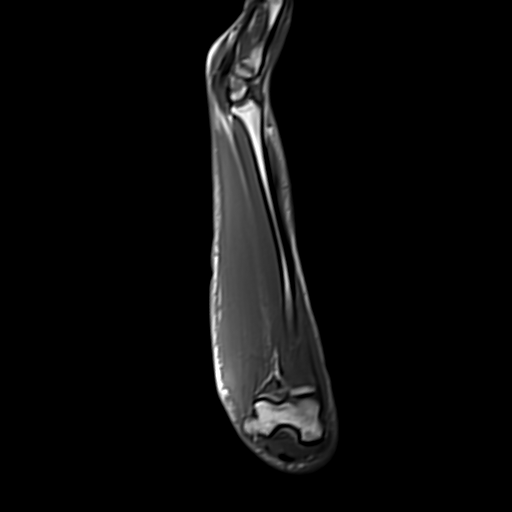
[im 21/21]
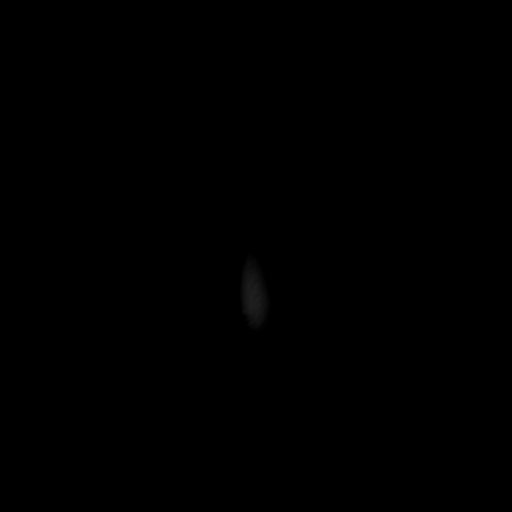

[Series 24: ir cor · coronal · 5.0mm · 0.78mm/px · 2 of 18 slices shown]
[im 1/18]
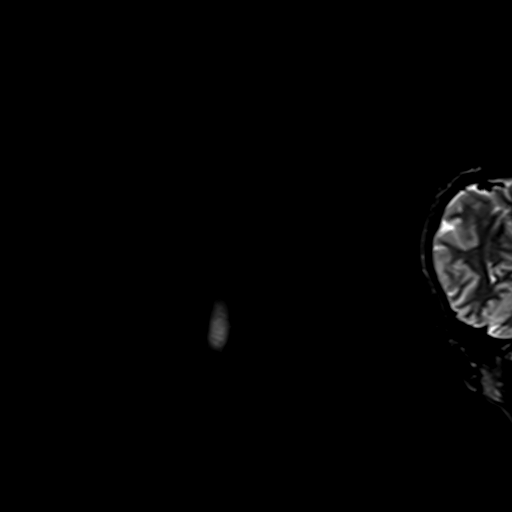
[im 18/18]
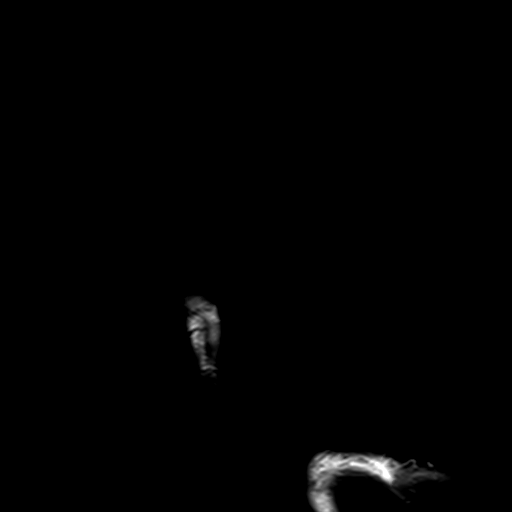

[Series 25: t1fs ax · sagittal · 5.0mm · 0.32mm/px · 2 of 60 slices shown]
[im 1/60]
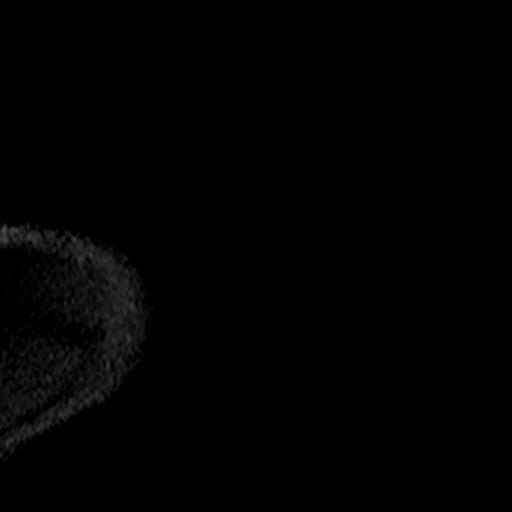
[im 10/60]
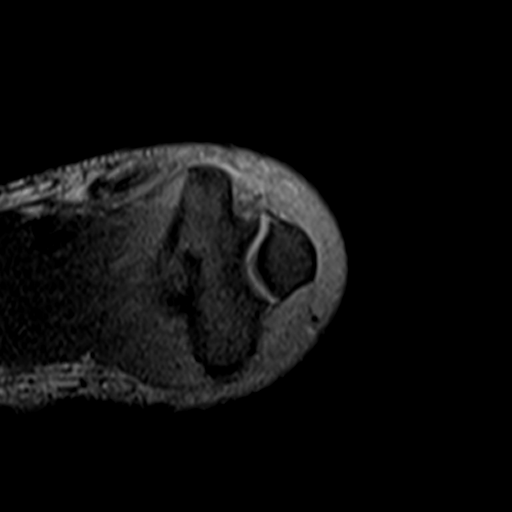

[19 of 40 positions shown; findings below may reference images not displayed]

FINDINGS: Bones/Joint/Cartilage

Normal signal throughout. No evidence of osteomyelitis. No fracture
stress change.

Ligaments

Intact.

Muscles and Tendons

There is edema and enhancement in the flexor carpi the ulna naris
and flexor digitorum superficialis muscles consistent with myositis.
No intramuscular fluid collection is identified.

Soft tissues

Marked subcutaneous edema and enhancement are present about the
forearm, worse on the medial side. There is a focal subcutaneous
fluid collection causing mass effect on the flexor digitorum
superficialis and flexor carpi ulnaris muscles. The collection is
septated and measures 8.5 cm craniocaudal by up to 3.2 cm AP by
cm transverse. Superficial fascia of the adjacent muscles is
edematous and enhancing.
IMPRESSION: Cellulitis of the right forearm is worse on the medial side where a
septated subcutaneous abscess is identified. The abscess abuts the
superficial fascia of the flexor digitorum superficialis and flexor
carpi ulnaris muscles. Edema and enhancement within the superficial
fascia and adjacent muscles is consistent with fasciitis and
myositis.

Negative for septic joint or osteomyelitis.

## 2021-03-09 ENCOUNTER — Other Ambulatory Visit: Payer: Self-pay

## 2021-03-09 ENCOUNTER — Emergency Department (HOSPITAL_COMMUNITY)
Admission: EM | Admit: 2021-03-09 | Discharge: 2021-03-09 | Discharge status: Against Medical Advice | Payer: 59 | Attending: Physician Assistant | Admitting: Physician Assistant

## 2021-03-09 DIAGNOSIS — H538 Other visual disturbances: Secondary | ICD-10-CM | POA: Insufficient documentation

## 2021-03-09 DIAGNOSIS — Z5321 Procedure and treatment not carried out due to patient leaving prior to being seen by health care provider: Secondary | ICD-10-CM | POA: Insufficient documentation

## 2021-03-09 MED ORDER — FLUORESCEIN SODIUM 1 MG OP STRP: 1.0000 | ORAL_STRIP | Freq: Once | OPHTHALMIC | Status: DC

## 2021-03-09 MED ORDER — TETRACAINE HCL 0.5 % OP SOLN: 1.0000 [drp] | Freq: Once | OPHTHALMIC | Status: DC

## 2021-03-09 NOTE — ED Provider Notes (Signed)
Emergency Medicine Provider Triage Evaluation Note  Paul Mckee , a 30 y.o. male  was evaluated in triage.  Pt complains of right lateral eye irritation and foreign body sensation since waking up this morning.  He has irritation when trying to open his eye completely and feels that his vision is blurred when he does open his eye.  Reports some clear drainage from the area.  No trauma that he is aware of.  Does not wear contact lenses.  Review of Systems  Positive: Eye irritation, drainage Negative: Pain with EOM  Physical Exam  BP 121/80 (BP Location: Left Arm)    Pulse 82    Temp 100 F (37.8 C) (Oral)    Resp 18    SpO2 100%  Gen:   Awake, no distress   Resp:  Normal effort  MSK:   Moves extremities without difficulty  Other:  Pupils are equal and reactive bilaterally and EOMs are intact bilaterally  Medical Decision Making  Medically screening exam initiated at 10:59 AM.  Appropriate orders placed.  Paul Mckee was informed that the remainder of the evaluation will be completed by another provider, this initial triage assessment does not replace that evaluation, and the importance of remaining in the ED until their evaluation is complete.  Will need fluorescein stain visual acuity   Dietrich Pates, PA-C 03/09/21 1100    Wynetta Fines, MD 03/14/21 [phone]

## 2021-03-09 NOTE — ED Triage Notes (Signed)
Pt woke up with stabbing pain in lateral R eye with some blurry vision to same. Pt unable to open eye d/t pain.

## 2021-03-09 NOTE — ED Notes (Signed)
Called pt 1x for vitals, no response.  °

## 2021-04-04 ENCOUNTER — Emergency Department (HOSPITAL_COMMUNITY)
Admission: EM | Admit: 2021-04-04 | Discharge: 2021-04-05 | Disposition: A | Discharge status: Home / Self Care | Payer: 59 | Attending: Emergency Medicine | Admitting: Emergency Medicine

## 2021-04-04 DIAGNOSIS — K0889 Other specified disorders of teeth and supporting structures: Secondary | ICD-10-CM | POA: Diagnosis present

## 2021-04-04 MED ORDER — PENICILLIN V POTASSIUM 500 MG PO TABS: 500.0000 mg | ORAL_TABLET | Freq: Four times a day (QID) | ORAL | 0 refills | Status: DC

## 2021-04-04 MED ORDER — PENICILLIN V POTASSIUM 500 MG PO TABS: 500.0000 mg | ORAL_TABLET | Freq: Four times a day (QID) | ORAL | 0 refills | Status: AC

## 2021-04-04 MED ORDER — HYDROCODONE-ACETAMINOPHEN 5-325 MG PO TABS
2.0000 | ORAL_TABLET | Freq: Once | ORAL | Status: AC
  Administered 2021-04-04: 2 via ORAL
  Filled 2021-04-04: qty 2

## 2021-04-04 MED ORDER — PENICILLIN V POTASSIUM 250 MG PO TABS
500.0000 mg | ORAL_TABLET | Freq: Once | ORAL | Status: AC
  Administered 2021-04-04: 500 mg via ORAL
  Filled 2021-04-04 (×2): qty 2

## 2021-04-04 NOTE — ED Triage Notes (Signed)
Pt c/o R upper dental pain x3 days, alternated tylenol & ibuprofen, no relief. No dentist, no insurance, states ED was only option. Endorses associated fever, SHOB, tachycardia. Hx IVDU

## 2021-04-04 NOTE — ED Provider Notes (Signed)
°  [facility] Emergency Department Provider Note MRN:  [id number]  Arrival date & time: 04/04/21     Chief Complaint   Dental Pain   History of Present Illness   Paul Mckee is a 31 y.o. year-old male presents to the ED with chief complaint of dental pain.  Has acute on chronic dental pain.  States that he feels like his teeth are getting infected.  He does not have a dentist.  He has tried taking some of his children's amoxicillin without relief.  He has tried alternating Tylenol and Motrin..    Review of Systems  Pertinent review of systems noted in HPI.    Physical Exam   Vitals:   04/04/21 2307  BP: (!) 147/83  Pulse: 100  Resp: 17  Temp: 98.7 F (37.1 C)  SpO2: 99%    CONSTITUTIONAL: Nontoxic-appearing, NAD NEURO:  Alert and oriented x 3, CN 3-12 grossly intact EYES:  eyes equal and reactive ENT/NECK:  Supple, no stridor, extremely poor dentition, no drainable abscess CARDIO:  normal rate, regular rhythm, appears well-perfused PULM:  No respiratory distress GI/GU:  non-distended,  MSK/SPINE:  No gross deformities, no edema, moves all extremities  SKIN:  no rash, atraumatic   *Additional and/or pertinent findings included in MDM below  Diagnostic and Interventional Summary    EKG Interpretation  Date/Time:    Ventricular Rate:    PR Interval:    QRS Duration:   QT Interval:    QTC Calculation:   R Axis:     Text Interpretation:         Labs Reviewed - No data to display  No orders to display    Medications  penicillin v potassium (VEETID) tablet 500 mg (has no administration in time range)  HYDROcodone-acetaminophen (NORCO/VICODIN) 5-325 MG per tablet 2 tablet (has no administration in time range)     Procedures  /  Critical Care Procedures  ED Course and Medical Decision Making  I have reviewed the triage vital signs, the nursing notes, and pertinent available records from the EMR.  Complexity of Problems  Addressed Acute uncomplicated illness or injury with no diagnostics  Additional Data Reviewed and Analyzed Further history obtained from: Prior ED visit notes    ED Course    Patient with dentalgia.  No abscess requiring immediate incision and drainage.  Exam not concerning for Ludwig's angina or pharyngeal abscess.  Will treat with penicillin. Pt instructed to follow-up with dentist.  Discussed return precautions. Pt safe for discharge.     Final Clinical Impressions(s) / ED Diagnoses     ICD-10-CM   1. Pain, dental  K08.89       ED Discharge Orders          Ordered    penicillin v potassium (VEETID) 500 MG tablet  4 times daily        04/04/21 2318             Discharge Instructions Discussed with and Provided to Patient:   Discharge Instructions   None      Roxy Horseman, PA-C 04/04/21 2320    Ernie Avena, MD 04/05/21 [phone]
